# Patient Record
Sex: Female | Born: 1965
Health system: Southern US, Community
[De-identification: ages and names within clinical notes are randomized; demographics above are authoritative.]

## PROBLEM LIST (undated history)

## (undated) DIAGNOSIS — F419 Anxiety disorder, unspecified: Secondary | ICD-10-CM

## (undated) DIAGNOSIS — R011 Cardiac murmur, unspecified: Secondary | ICD-10-CM

## (undated) DIAGNOSIS — Z8719 Personal history of other diseases of the digestive system: Secondary | ICD-10-CM

## (undated) DIAGNOSIS — K219 Gastro-esophageal reflux disease without esophagitis: Secondary | ICD-10-CM

## (undated) DIAGNOSIS — M1990 Unspecified osteoarthritis, unspecified site: Secondary | ICD-10-CM

## (undated) DIAGNOSIS — R112 Nausea with vomiting, unspecified: Secondary | ICD-10-CM

## (undated) DIAGNOSIS — Z9889 Other specified postprocedural states: Secondary | ICD-10-CM

## (undated) DIAGNOSIS — I10 Essential (primary) hypertension: Secondary | ICD-10-CM

## (undated) DIAGNOSIS — D649 Anemia, unspecified: Secondary | ICD-10-CM

## (undated) DIAGNOSIS — I739 Peripheral vascular disease, unspecified: Secondary | ICD-10-CM

## (undated) HISTORY — PX: TUBAL LIGATION: SHX77

## (undated) HISTORY — PX: TONSILLECTOMY: SUR1361

## (undated) HISTORY — PX: BACK SURGERY: SHX140

## (undated) SURGICAL SUPPLY — 56 items
BAND RUBBER #18 3X1/16 STRL (MISCELLANEOUS) ×2
BLADE SURG 11 STRL SS (BLADE)
BUR MATCHSTICK NEURO 3.0 LAGG (BURR) ×1
CONT SPEC 4OZ STRL OR WHT (MISCELLANEOUS) ×1
COVER BACK TABLE 60X90IN (DRAPES) ×1
COVER MAYO STAND STRL (DRAPES) ×1
COVER WAND RF STERILE (DRAPES)
DECANTER SPIKE VIAL GLASS SM (MISCELLANEOUS) ×2
DERMABOND ADVANCED (GAUZE/BANDAGES/DRESSINGS) ×1
DIFFUSER DRILL AIR PNEUMATIC (MISCELLANEOUS) ×1
DRAPE C-ARM 42X72 X-RAY (DRAPES) ×1
DRAPE LAPAROTOMY 100X72X124 (DRAPES) ×1
DRAPE MICROSCOPE LEICA (MISCELLANEOUS) ×1
DRAPE SURG 17X23 STRL (DRAPES)
DRSG OPSITE POSTOP 3X4 (GAUZE/BANDAGES/DRESSINGS) ×1
DURAPREP 26ML APPLICATOR (WOUND CARE) ×1
ELECT BLADE INSULATED 4IN (ELECTROSURGICAL) ×1
ELECT BLADE INSULATED 6.5IN (ELECTROSURGICAL) ×1
ELECT REM PT RETURN 9FT ADLT (ELECTROSURGICAL) ×1
GAUZE 4X4 16PLY RFD (DISPOSABLE)
GAUZE SPONGE 4X4 12PLY STRL (GAUZE/BANDAGES/DRESSINGS)
GLOVE BIO SURGEON STRL SZ 6.5 (GLOVE) ×4
GLOVE BIOGEL PI INDICATOR 6.5 (GLOVE) ×2
GLOVE BIOGEL PI INDICATOR 8 (GLOVE) ×1
GLOVE ECLIPSE 8.0 STRL XLNG CF (GLOVE) ×2
GLOVE SUPERSENSE BIOGEL SZ 6.5 (GLOVE) ×2
GOWN STRL REUS W/TWL 2XL LVL3 (GOWN DISPOSABLE)
GOWN STRL REUS W/TWL LRG LVL3 (GOWN DISPOSABLE) ×4
GOWN STRL REUS W/TWL XL LVL3 (GOWN DISPOSABLE) ×1
HEMOSTAT POWDER KIT SURGIFOAM (HEMOSTASIS) ×1
KIT BASIN OR (CUSTOM PROCEDURE TRAY) ×1
KIT POSITION SURG JACKSON T1 (MISCELLANEOUS) ×1
KIT TURNOVER KIT B (KITS) ×1
MARKER SKIN DUAL TIP RULER LAB (MISCELLANEOUS) ×1
NEEDLE HYPO 18GX1.5 BLUNT FILL (NEEDLE) ×1
NEEDLE HYPO 25X1 1.5 SAFETY (NEEDLE) ×1
NEEDLE SPNL 18GX3.5 QUINCKE PK (NEEDLE)
NEEDLE SPNL 22GX3.5 QUINCKE BK (NEEDLE) ×1
NS IRRIG 1000ML POUR BTL (IV SOLUTION) ×1
OIL CARTRIDGE MAESTRO DRILL (MISCELLANEOUS) ×1
PACK LAMINECTOMY NEURO (CUSTOM PROCEDURE TRAY) ×1
PAD ARMBOARD 7.5X6 YLW CONV (MISCELLANEOUS) ×3
PATTIES SURGICAL .5 X.5 (GAUZE/BANDAGES/DRESSINGS)
PATTIES SURGICAL .5 X1 (DISPOSABLE)
PATTIES SURGICAL 1X1 (DISPOSABLE)
SPONGE LAP 4X18 RFD (DISPOSABLE)
SPONGE SURGIFOAM ABS GEL SZ50 (HEMOSTASIS)
STAPLER VISISTAT 35W (STAPLE)
SUT VIC AB 0 CT1 27 (SUTURE)
SUT VIC AB 2-0 CP2 18 (SUTURE) ×1
SUT VIC AB 3-0 SH 8-18 (SUTURE)
SYR 3ML LL SCALE MARK (SYRINGE) ×1
TOWEL GREEN STERILE (TOWEL DISPOSABLE)
TOWEL GREEN STERILE FF (TOWEL DISPOSABLE)
TRAY FOLEY MTR SLVR 16FR STAT (SET/KITS/TRAYS/PACK)
WATER STERILE IRR 1000ML POUR (IV SOLUTION) ×1

## (undated) SURGICAL SUPPLY — 61 items
BAG COUNTER SPONGE SURGICOUNT (BAG) ×1
BAND RUBBER #18 3X1/16 STRL (MISCELLANEOUS) ×2
BUR 2.5 MTCH HD 16 (BUR) ×1
BUR MATCHSTICK NEURO 3.0 LAGG (BURR) ×1
CASCADIA AN CONVEX 8.5X28X11 (Cage) ×1 IMPLANT
CONT SPEC 4OZ STRL OR WHT (MISCELLANEOUS) ×1
COVER BACK TABLE 60X90IN (DRAPES) ×1
COVER MAYO STAND STRL (DRAPES) ×2
DERMABOND ADVANCED (GAUZE/BANDAGES/DRESSINGS) ×1
DRAIN JACKSON RD 7FR 3/32 (WOUND CARE)
DRAPE C-ARM 42X72 X-RAY (DRAPES) ×2
DRAPE LAPAROTOMY 100X72X124 (DRAPES) ×1
DRAPE MICROSCOPE LEICA (MISCELLANEOUS) ×1
DRAPE STERI IOBAN 125X83 (DRAPES)
DRAPE SURG 17X23 STRL (DRAPES) ×1
DRSG OPSITE POSTOP 3X4 (GAUZE/BANDAGES/DRESSINGS) ×2
DRSG OPSITE POSTOP 4X6 (GAUZE/BANDAGES/DRESSINGS) ×1
DURAPREP 26ML APPLICATOR (WOUND CARE) ×1
ELECT BLADE INSULATED 6.5IN (ELECTROSURGICAL) ×1
ELECT REM PT RETURN 9FT ADLT (ELECTROSURGICAL) ×1
GAUZE 4X4 16PLY ~~LOC~~+RFID DBL (SPONGE)
GAUZE SPONGE 4X4 12PLY STRL (GAUZE/BANDAGES/DRESSINGS) ×1
GLOVE BIOGEL PI INDICATOR 8 (GLOVE) ×2
GLOVE ECLIPSE 8.0 STRL XLNG CF (GLOVE) ×2
GLOVE SURG ENC MOIS LTX SZ8 (GLOVE) ×2
GLOVE SURG UNDER POLY LF SZ8.5 (GLOVE) ×2
GOWN STRL REUS W/TWL 2XL LVL3 (GOWN DISPOSABLE)
GOWN STRL REUS W/TWL LRG LVL3 (GOWN DISPOSABLE)
GOWN STRL REUS W/TWL XL LVL3 (GOWN DISPOSABLE) ×2
GUIDEWIRE EVEREST 1.4X620 2-PK (WIRE) ×4
HEMOSTAT POWDER KIT SURGIFOAM (HEMOSTASIS) ×1
KIT BASIN OR (CUSTOM PROCEDURE TRAY) ×1
KIT INFUSE XX SMALL 0.7CC (Orthopedic Implant) ×1 IMPLANT
KIT POSITION SURG JACKSON T1 (MISCELLANEOUS) ×1
KIT TURNOVER KIT B (KITS) ×1
MARKER SKIN DUAL TIP RULER LAB (MISCELLANEOUS) ×1
NEEDLE BIOPSY DD SERENGETI 8G (NEEDLE) ×1
NEEDLE HYPO 25X1 1.5 SAFETY (NEEDLE) ×1
NEEDLE SPNL 22GX3.5 QUINCKE BK (NEEDLE) ×1
NS IRRIG 1000ML POUR BTL (IV SOLUTION) ×1
PACK LAMINECTOMY NEURO (CUSTOM PROCEDURE TRAY) ×1
PAD ARMBOARD 7.5X6 YLW CONV (MISCELLANEOUS) ×3
PATTIES SURGICAL .5 X.5 (GAUZE/BANDAGES/DRESSINGS)
PATTIES SURGICAL .5 X1 (DISPOSABLE)
PATTIES SURGICAL 1X1 (DISPOSABLE)
PUTTY BONE 100 VESUVIUS 2.5CC (Putty) ×1 IMPLANT
ROD CONT BN EVEREST 5.5X65 (Rod) ×2 IMPLANT
SCREW PA FENS EVEREST 6.5X35 (Screw) ×2 IMPLANT
SCREW PA FENS EVEREST 6.5X40 (Screw) ×2 IMPLANT
SCREW PA FENS EVEREST 6.5X50 (Screw) ×2 IMPLANT
SET SCREW (Screw) ×6 IMPLANT
SPONGE SURGIFOAM ABS GEL SZ50 (HEMOSTASIS) ×1
SPONGE T-LAP 4X18 ~~LOC~~+RFID (SPONGE)
STAPLER VISISTAT 35W (STAPLE)
SUT VIC AB 0 CT1 8-18 (SUTURE) ×2
SUT VIC AB 2-0 CP2 18 (SUTURE) ×3
SUT VIC AB 3-0 SH 8-18 (SUTURE) ×2
TOWEL GREEN STERILE (TOWEL DISPOSABLE)
TOWEL GREEN STERILE FF (TOWEL DISPOSABLE)
TRAY FOLEY MTR SLVR 16FR STAT (SET/KITS/TRAYS/PACK) ×1
WATER STERILE IRR 1000ML POUR (IV SOLUTION) ×1

---

## 2012-06-08 DIAGNOSIS — I82409 Acute embolism and thrombosis of unspecified deep veins of unspecified lower extremity: Secondary | ICD-10-CM

## 2012-06-08 HISTORY — DX: Acute embolism and thrombosis of unspecified deep veins of unspecified lower extremity: I82.409

## 2017-06-18 DIAGNOSIS — J329 Chronic sinusitis, unspecified: Secondary | ICD-10-CM | POA: Diagnosis not present

## 2017-06-18 DIAGNOSIS — R05 Cough: Secondary | ICD-10-CM | POA: Diagnosis not present

## 2017-06-18 DIAGNOSIS — R009 Unspecified abnormalities of heart beat: Secondary | ICD-10-CM | POA: Diagnosis not present

## 2017-06-23 DIAGNOSIS — R05 Cough: Secondary | ICD-10-CM | POA: Diagnosis not present

## 2017-06-23 DIAGNOSIS — J329 Chronic sinusitis, unspecified: Secondary | ICD-10-CM | POA: Diagnosis not present

## 2017-06-23 DIAGNOSIS — R062 Wheezing: Secondary | ICD-10-CM | POA: Diagnosis not present

## 2018-03-18 DIAGNOSIS — Z01419 Encounter for gynecological examination (general) (routine) without abnormal findings: Secondary | ICD-10-CM | POA: Diagnosis not present

## 2018-03-18 DIAGNOSIS — Z124 Encounter for screening for malignant neoplasm of cervix: Secondary | ICD-10-CM | POA: Diagnosis not present

## 2018-03-18 DIAGNOSIS — Z1231 Encounter for screening mammogram for malignant neoplasm of breast: Secondary | ICD-10-CM | POA: Diagnosis not present

## 2018-05-02 DIAGNOSIS — Z0000 Encounter for general adult medical examination without abnormal findings: Secondary | ICD-10-CM | POA: Diagnosis not present

## 2018-05-02 DIAGNOSIS — Z1231 Encounter for screening mammogram for malignant neoplasm of breast: Secondary | ICD-10-CM | POA: Diagnosis not present

## 2018-05-02 DIAGNOSIS — Z6841 Body Mass Index (BMI) 40.0 and over, adult: Secondary | ICD-10-CM | POA: Diagnosis not present

## 2018-07-09 DIAGNOSIS — J111 Influenza due to unidentified influenza virus with other respiratory manifestations: Secondary | ICD-10-CM | POA: Diagnosis not present

## 2018-07-10 DIAGNOSIS — Z792 Long term (current) use of antibiotics: Secondary | ICD-10-CM | POA: Diagnosis not present

## 2018-07-10 DIAGNOSIS — E860 Dehydration: Secondary | ICD-10-CM | POA: Diagnosis not present

## 2018-07-10 DIAGNOSIS — J111 Influenza due to unidentified influenza virus with other respiratory manifestations: Secondary | ICD-10-CM | POA: Diagnosis not present

## 2018-08-29 DIAGNOSIS — R05 Cough: Secondary | ICD-10-CM | POA: Diagnosis not present

## 2018-08-29 DIAGNOSIS — Z7689 Persons encountering health services in other specified circumstances: Secondary | ICD-10-CM | POA: Diagnosis not present

## 2018-09-09 DIAGNOSIS — Z6841 Body Mass Index (BMI) 40.0 and over, adult: Secondary | ICD-10-CM | POA: Diagnosis not present

## 2018-09-09 DIAGNOSIS — J40 Bronchitis, not specified as acute or chronic: Secondary | ICD-10-CM | POA: Diagnosis not present

## 2019-04-28 DIAGNOSIS — Z20822 Contact with and (suspected) exposure to covid-19: Secondary | ICD-10-CM

## 2020-05-07 NOTE — Progress Notes (Signed)
Your procedure is scheduled on Friday, December 3rd.  Report to [facility] Main Entrance "A" at 5:30 A.M., and check in at the Admitting office.  Call this number if you have problems the morning of surgery:  [phone]  Call [phone] if you have any questions prior to your surgery date Monday-Friday 8am-4pm   Remember:  Do not eat or drink after midnight the night before your surgery    Take these medicines the morning of surgery with A SIP OF WATER  cyclobenzaprine (FLEXERIL) docusate sodium (COLACE) esomeprazole (NEXIUM) gabapentin (NEURONTIN) sertraline (ZOLOFT)   If needed: acetaminophen (TYLENOL)   As of today, STOP taking any Aspirin (unless otherwise instructed by your surgeon) Aleve, Naproxen, Ibuprofen, Motrin, Advil, Goody's, BC's, all herbal medications, fish oil, and all vitamins.                     Do not wear jewelry, make up, or nail polish            Do not wear lotions, powders, perfumes or deodorant.            Do not shave 48 hours prior to surgery.              Do not bring valuables to the hospital.            [facility] is not responsible for any belongings or valuables.  Do NOT Smoke (Tobacco/Vaping) or drink Alcohol 24 hours prior to your procedure If you use a CPAP at night, you may bring all equipment for your overnight stay.   Contacts, glasses, dentures or bridgework may not be worn into surgery.      For patients admitted to the hospital, discharge time will be determined by your treatment team.   Patients discharged the day of surgery will not be allowed to drive home, and someone needs to stay with them for 24 hours.  Special instructions:   [place]- Preparing For Surgery  Before surgery, you can play an important role. Because skin is not sterile, your skin needs to be as free of germs as possible. You can reduce the number of germs on your skin by washing with CHG (chlorahexidine gluconate) Soap before surgery.  CHG is an antiseptic  cleaner which kills germs and bonds with the skin to continue killing germs even after washing.    Oral Hygiene is also important to reduce your risk of infection.  Remember - BRUSH YOUR TEETH THE MORNING OF SURGERY WITH YOUR REGULAR TOOTHPASTE  Please do not use if you have an allergy to CHG or antibacterial soaps. If your skin becomes reddened/irritated stop using the CHG.  Do not shave (including legs and underarms) for at least 48 hours prior to first CHG shower. It is OK to shave your face.  Please follow these instructions carefully.   1. Shower the NIGHT BEFORE SURGERY and the MORNING OF SURGERY with CHG Soap.   2. If you chose to wash your hair, wash your hair first as usual with your normal shampoo.  3. After you shampoo, rinse your hair and body thoroughly to remove the shampoo.  4. Use CHG as you would any other liquid soap. You can apply CHG directly to the skin and wash gently with a scrungie or a clean washcloth.   5. Apply the CHG Soap to your body ONLY FROM THE NECK DOWN.  Do not use on open wounds or open sores. Avoid contact with your eyes, ears,  mouth and genitals (private parts). Wash Face and genitals (private parts)  with your normal soap.   6. Wash thoroughly, paying special attention to the area where your surgery will be performed.  7. Thoroughly rinse your body with warm water from the neck down.  8. DO NOT shower/wash with your normal soap after using and rinsing off the CHG Soap.  9. Pat yourself dry with a CLEAN TOWEL.  10. Wear CLEAN PAJAMAS to bed the night before surgery  11. Place CLEAN SHEETS on your bed the night of your first shower and DO NOT SLEEP WITH PETS.  Day of Surgery: Wear Clean/Comfortable clothing the morning of surgery Do not apply any deodorants/lotions.   Remember to brush your teeth WITH YOUR REGULAR TOOTHPASTE.   Please read over the following fact sheets that you were given.

## 2020-05-08 DIAGNOSIS — Z01812 Encounter for preprocedural laboratory examination: Secondary | ICD-10-CM | POA: Diagnosis not present

## 2020-05-08 DIAGNOSIS — Z20822 Contact with and (suspected) exposure to covid-19: Secondary | ICD-10-CM | POA: Diagnosis not present

## 2020-05-08 HISTORY — DX: Unspecified osteoarthritis, unspecified site: M19.90

## 2020-05-08 HISTORY — DX: Peripheral vascular disease, unspecified: I73.9

## 2020-05-08 HISTORY — DX: Anemia, unspecified: D64.9

## 2020-05-08 HISTORY — DX: Gastro-esophageal reflux disease without esophagitis: K21.9

## 2020-05-08 HISTORY — DX: Nausea with vomiting, unspecified: Z98.890

## 2020-05-08 HISTORY — DX: Essential (primary) hypertension: I10

## 2020-05-08 HISTORY — DX: Anxiety disorder, unspecified: F41.9

## 2020-05-08 HISTORY — DX: Other specified postprocedural states: R11.2

## 2020-05-08 HISTORY — DX: Personal history of other diseases of the digestive system: Z87.19

## 2020-05-08 HISTORY — DX: Cardiac murmur, unspecified: R01.1

## 2020-05-08 NOTE — Progress Notes (Signed)
PCP - Cristal [address] (white [facility]) Cardiologist - denies  PPM/ICD - denies   Chest x-ray - n/a EKG - 05/08/2020 Stress Test - denies ECHO - denies Cardiac Cath - denies  Sleep Study - denies  Patient instructed to hold all Aspirin, NSAID's, herbal medications, fish oil and vitamins 7 days prior to surgery. Last dose of aspirin 81mg  was 05/01/2020.   ERAS Protcol -no   COVID TEST- 05/08/2020   Anesthesia review: no  Patient denies shortness of breath, fever, cough and chest pain at PAT appointment   All instructions explained to the patient, with a verbal understanding of the material. Patient agrees to go over the instructions while at home for a better understanding. Patient also instructed to self quarantine after being tested for COVID-19. The opportunity to ask questions was provided.

## 2020-05-08 NOTE — Progress Notes (Signed)
Your procedure is scheduled on Friday, December 3rd.  Report to [facility] Main Entrance "A" at 5:30 A.M., and check in at the Admitting office.  Call this number if you have problems the morning of surgery:  [phone]  Call [phone] if you have any questions prior to your surgery date Monday-Friday 8am-4pm   Remember:  Do not eat or drink after midnight the night before your surgery    Take these medicines the morning of surgery with A SIP OF WATER  cyclobenzaprine (FLEXERIL) esomeprazole (NEXIUM) gabapentin (NEURONTIN) sertraline (ZOLOFT)   If needed: acetaminophen (TYLENOL)   As of today, STOP taking any Aspirin (unless otherwise instructed by your surgeon) Aleve, Naproxen, Ibuprofen, Motrin, Advil, Goody's, BC's, all herbal medications, fish oil, and all vitamins.                     Do not wear jewelry, make up, or nail polish            Do not wear lotions, powders, perfumes or deodorant.            Do not shave 48 hours prior to surgery.              Do not bring valuables to the hospital.            [facility] is not responsible for any belongings or valuables.  Do NOT Smoke (Tobacco/Vaping) or drink Alcohol 24 hours prior to your procedure If you use a CPAP at night, you may bring all equipment for your overnight stay.   Contacts, glasses, dentures or bridgework may not be worn into surgery.      For patients admitted to the hospital, discharge time will be determined by your treatment team.   Patients discharged the day of surgery will not be allowed to drive home, and someone needs to stay with them for 24 hours.  Special instructions:   [place]- Preparing For Surgery  Before surgery, you can play an important role. Because skin is not sterile, your skin needs to be as free of germs as possible. You can reduce the number of germs on your skin by washing with CHG (chlorahexidine gluconate) Soap before surgery.  CHG is an antiseptic cleaner which kills germs  and bonds with the skin to continue killing germs even after washing.    Oral Hygiene is also important to reduce your risk of infection.  Remember - BRUSH YOUR TEETH THE MORNING OF SURGERY WITH YOUR REGULAR TOOTHPASTE  Please do not use if you have an allergy to CHG or antibacterial soaps. If your skin becomes reddened/irritated stop using the CHG.  Do not shave (including legs and underarms) for at least 48 hours prior to first CHG shower. It is OK to shave your face.  Please follow these instructions carefully.   1. Shower the NIGHT BEFORE SURGERY and the MORNING OF SURGERY with CHG Soap.   2. If you chose to wash your hair, wash your hair first as usual with your normal shampoo.  3. After you shampoo, rinse your hair and body thoroughly to remove the shampoo.  4. Use CHG as you would any other liquid soap. You can apply CHG directly to the skin and wash gently with a scrungie or a clean washcloth.   5. Apply the CHG Soap to your body ONLY FROM THE NECK DOWN.  Do not use on open wounds or open sores. Avoid contact with your eyes, ears, mouth and genitals (  private parts). Wash Face and genitals (private parts)  with your normal soap.   6. Wash thoroughly, paying special attention to the area where your surgery will be performed.  7. Thoroughly rinse your body with warm water from the neck down.  8. DO NOT shower/wash with your normal soap after using and rinsing off the CHG Soap.  9. Pat yourself dry with a CLEAN TOWEL.  10. Wear CLEAN PAJAMAS to bed the night before surgery  11. Place CLEAN SHEETS on your bed the night of your first shower and DO NOT SLEEP WITH PETS.  Day of Surgery: Wear Clean/Comfortable clothing the morning of surgery Do not apply any deodorants/lotions.   Remember to brush your teeth WITH YOUR REGULAR TOOTHPASTE.   Please read over the following fact sheets that you were given.

## 2020-05-09 NOTE — Anesthesia Preprocedure Evaluation (Addendum)
Anesthesia Evaluation  Patient identified by MRN, date of birth, ID band Patient awake    Reviewed: Allergy & Precautions, NPO status , Patient's Chart, lab work & pertinent test results  History of Anesthesia Complications (+) PONVNegative for: history of anesthetic complications  Airway Mallampati: III  TM Distance: >3 FB Neck ROM: Full    Dental  (+) Teeth Intact   Pulmonary neg pulmonary ROS,    Pulmonary exam normal        Cardiovascular hypertension, Pt. on medications + Peripheral Vascular Disease and + DVT  Normal cardiovascular exam     Neuro/Psych Anxiety negative neurological ROS     GI/Hepatic Neg liver ROS, hiatal hernia, GERD  ,  Endo/Other  Morbid obesity  Renal/GU negative Renal ROS  negative genitourinary   Musculoskeletal  (+) Arthritis ,   Abdominal   Peds  Hematology negative hematology ROS (+)   Anesthesia Other Findings   Reproductive/Obstetrics                            Anesthesia Physical Anesthesia Plan  ASA: III  Anesthesia Plan: General   Post-op Pain Management:    Induction: Intravenous  PONV Risk Score and Plan: 4 or greater and Ondansetron, Dexamethasone, Treatment may vary due to age or medical condition and Midazolam  Airway Management Planned: Oral ETT  Additional Equipment: None  Intra-op Plan:   Post-operative Plan: Extubation in OR  Informed Consent: I have reviewed the patients History and Physical, chart, labs and discussed the procedure including the risks, benefits and alternatives for the proposed anesthesia with the patient or authorized representative who has indicated his/her understanding and acceptance.     Dental advisory given  Plan Discussed with:   Anesthesia Plan Comments:        Anesthesia Quick Evaluation

## 2020-05-10 DIAGNOSIS — Z6841 Body Mass Index (BMI) 40.0 and over, adult: Secondary | ICD-10-CM | POA: Diagnosis not present

## 2020-05-10 DIAGNOSIS — Z79899 Other long term (current) drug therapy: Secondary | ICD-10-CM | POA: Insufficient documentation

## 2020-05-10 DIAGNOSIS — I739 Peripheral vascular disease, unspecified: Secondary | ICD-10-CM | POA: Insufficient documentation

## 2020-05-10 DIAGNOSIS — Z86718 Personal history of other venous thrombosis and embolism: Secondary | ICD-10-CM | POA: Diagnosis not present

## 2020-05-10 DIAGNOSIS — M5116 Intervertebral disc disorders with radiculopathy, lumbar region: Secondary | ICD-10-CM | POA: Insufficient documentation

## 2020-05-10 DIAGNOSIS — Z419 Encounter for procedure for purposes other than remedying health state, unspecified: Secondary | ICD-10-CM

## 2020-05-10 DIAGNOSIS — M1990 Unspecified osteoarthritis, unspecified site: Secondary | ICD-10-CM | POA: Diagnosis not present

## 2020-05-10 DIAGNOSIS — Z7982 Long term (current) use of aspirin: Secondary | ICD-10-CM | POA: Diagnosis not present

## 2020-05-10 DIAGNOSIS — I10 Essential (primary) hypertension: Secondary | ICD-10-CM | POA: Diagnosis not present

## 2020-05-10 DIAGNOSIS — Z791 Long term (current) use of non-steroidal anti-inflammatories (NSAID): Secondary | ICD-10-CM | POA: Insufficient documentation

## 2020-05-10 DIAGNOSIS — K219 Gastro-esophageal reflux disease without esophagitis: Secondary | ICD-10-CM | POA: Diagnosis not present

## 2020-05-10 HISTORY — PX: LUMBAR LAMINECTOMY/ DECOMPRESSION WITH MET-RX: SHX5959

## 2020-05-10 MED ORDER — ASPIRIN EC 81 MG PO TBEC
81.0000 mg | DELAYED_RELEASE_TABLET | Freq: Every day | ORAL | 11 refills | Status: DC
Start: 2020-05-17 — End: 2021-09-19

## 2020-05-10 MED FILL — METHOCARBAMOL 500 MG TABS: 500 | 20 days supply | Qty: 60 | Fill #0

## 2020-05-10 MED FILL — HYDROCODON-APAP 5-325: 5-325 | 7 days supply | Qty: 30 | Fill #0

## 2020-05-10 NOTE — Transfer of Care (Signed)
Immediate Anesthesia Transfer of Care Note  Patient: Patty Wong  Procedure(s) Performed: MICRODISCECTOMY LEFT LUMBAR THREE- LUMBAR FOUR (N/A Spine Lumbar)  Patient Location: PACU  Anesthesia Type:General  Level of Consciousness: awake, patient cooperative and responds to stimulation  Airway & Oxygen Therapy: Patient Spontanous Breathing and Patient connected to face mask oxygen  Post-op Assessment: Report given to RN, Post -op Vital signs reviewed and stable and Patient moving all extremities X 4  Post vital signs: Reviewed and stable  Last Vitals:  Vitals Value Taken Time  BP 119/89 05/10/20 0943  Temp 36.4 C 05/10/20 0940  Pulse 92 05/10/20 0944  Resp 28 05/10/20 0944  SpO2 100 % 05/10/20 0944  Vitals shown include unvalidated device data.  Last Pain:  Vitals:   05/10/20 0604  TempSrc: Oral  PainSc:       Patients Stated Pain Goal: 2 (05/10/20 0558)  Complications: No complications documented.

## 2020-05-10 NOTE — Anesthesia Postprocedure Evaluation (Signed)
Anesthesia Post Note  Patient: Patty Wong  Procedure(s) Performed: MICRODISCECTOMY LEFT LUMBAR THREE- LUMBAR FOUR (N/A Spine Lumbar)     Patient location during evaluation: PACU Anesthesia Type: General Level of consciousness: awake and alert Pain management: pain level controlled Vital Signs Assessment: post-procedure vital signs reviewed and stable Respiratory status: spontaneous breathing, nonlabored ventilation and respiratory function stable Cardiovascular status: blood pressure returned to baseline and stable Postop Assessment: no apparent nausea or vomiting Anesthetic complications: no   No complications documented.  Last Vitals:  Vitals:   05/10/20 1015 05/10/20 1030  BP: 117/79 122/81  Pulse: 91 90  Resp: 11 15  Temp:    SpO2: 100% 100%    Last Pain:  Vitals:   05/10/20 1045  TempSrc:   PainSc: 0-No pain                 Lucretia Kern

## 2020-05-10 NOTE — Progress Notes (Signed)
   Providing Compassionate, Quality Care - Together  NEUROSURGERY PROGRESS NOTE   S: Patient seen and examined in PACU  O: EXAM:  BP 122/81   Pulse 90   Temp 97.6 F (36.4 C)   Resp 15   Ht 5\' 5"  (1.651 m)   Wt 117.8 kg   SpO2 100%   BMI 43.20 kg/m   Awake, alert, oriented  Speech fluent, appropriate  CNs grossly intact  5/5 BUE/BLE  Incision clean dry intact, dressing clean dry intact Sensory intact light touch  ASSESSMENT:  54 y.o. female with  1.  L4 radiculopathy from L3-4 inferiorly migrated herniated nucleus pulposus, left  PLAN: -DC home -Pain controlled, preop pain improved -Prescription given -Has follow-up appointment -Light activity and wound care discussed with patient significant other     Thank you for allowing me to participate in this patient's care.  Please do not hesitate to call with questions or concerns.   56, DO Neurosurgeon [facility] Cell: [phone]

## 2020-05-10 NOTE — Anesthesia Procedure Notes (Signed)
Procedure Name: Intubation Date/Time: 05/10/2020 7:42 AM Performed by: Verdie Drown, CRNA Pre-anesthesia Checklist: Patient identified, Emergency Drugs available, Suction available and Patient being monitored Patient Re-evaluated:Patient Re-evaluated prior to induction Oxygen Delivery Method: Circle System Utilized Preoxygenation: Pre-oxygenation with 100% oxygen Induction Type: IV induction Ventilation: Mask ventilation without difficulty Laryngoscope Size: Mac and 3 Grade View: Grade II Tube type: Oral Tube size: 7.5 mm Number of attempts: 1 Airway Equipment and Method: Stylet and Oral airway Placement Confirmation: ETT inserted through vocal cords under direct vision,  positive ETCO2 and breath sounds checked- equal and bilateral Secured at: 22 cm Tube secured with: Tape Dental Injury: Teeth and Oropharynx as per pre-operative assessment

## 2020-05-10 NOTE — Op Note (Signed)
Date of service: 05/10/2020  PREOP DIAGNOSIS: Lumbar disc herniation, L3-4, left with left L4 radiculopathy  POSTOP DIAGNOSIS: Same  PROCEDURE: 1.  L3-4 laminotomy, medial facetectomy and microdiscectomy for decompression of left L4 nerve root 2. Use of operating microscope 3. Use of intraoperative fluoroscopy  SURGEON: Dr. Kendell Bane C. Kiara Mcdowell, DO  ASSISTANT: None  ANESTHESIA: General Endotracheal  EBL: 10 cc  SPECIMENS: None  DRAINS: None  COMPLICATIONS: None  CONDITION: Hemodynamically stable  HISTORY: Patty Wong is a 54 y.o. female presented with left lower extremity L4 radiculopathy and weakness and on imaging was found to have a left L3-4 herniated nucleus pulposus with inferior migration from the L3-4 disc to the left L4 pedicle.  She tried conservative measures including medication and light activity in which her pain progressed and she failed conservative therapies. She continued to have buckling episodes of her LLE while walking and severe pain, numbness and tingling. We discussed surgical intervention and she agreed to proceed.  Risks and benefits were discussed and agreed upon.  PROCEDURE IN DETAIL: After informed consent was obtained and witnessed, the patient was brought to the operating room. After induction of general anesthesia, the patient was positioned on the operative table in the prone position with all pressure points meticulously padded. The skin of the low back was then prepped and draped in the usual sterile fashion.  Under fluoroscopy, the L3-4 level was identified and marked out on the skin, and after timeout was conducted, the skin was infiltrated with local anesthetic. Skin incision was then made sharply and Bovie electrocautery was used to dissect the subcutaneous tissue until the lumbodorsal fascia was identified. The fascia was then incised using Bovie electrocautery.  Using the Metrix dilators, the left L3- lamina-facet junction was docked and a 8 mm x 20  mm tapered tube was placed.  Lateral fluoroscopy confirmed appropriate placement.  The microscope was sterilely draped and brought into the field.  Using Bovie electrocautery, the left L3 lamina and medial facet were exposed of soft tissue.  Using a high-speed drill, laminotomy was completed with a partial medial facetectomy. The ligamentum flavum was then identified and removed and the lateral edge of the thecal sac was identified. This was then traced down to identify the traversing L4 nerve root. Dissection was then carried out superior and lateral to the nerve root to identify the disc herniation. The posterior annulus was then coagulated with bipolar forceps and incised and using a combination of dissectors, curettes, and rongeurs, the herniated disc fragment was removed.  This fragment was noted again to be inferiorly migrated below the disc space along the left L4 pedicle.  Multiple small fragments were removed. The decompression of the nerve root was confirmed using a micro Murphy ball probe, as it was traced in the lateral recess and along the L4 pedicle.  Hemostasis was then secured using a combination of morcellized Gelfoam and thrombin and bipolar electrocautery. The nerve root was then covered with a long-acting steroid solution (Depo-Medrol, 40 mg).  The Metrix tube was removed and hemostasis was achieved with bipolar cautery in the soft tissues.  The wound was closed in layers with 2-0 Vicryl sutures. The skin was closed using standard skin glue.  Sterile dressing was applied.  At the end of the case all sponge, needle, and instrument counts were correct. The patient was transferred to the stretcher, extubated and taken to the postanesthesia care unit in stable hemodynamic condition.

## 2020-05-10 NOTE — H&P (Signed)
Providing Compassionate, Quality Care - Together  NEUROSURGERY HISTORY & PHYSICAL   Patty Wong is an 54 y.o. female.   Chief Complaint: Left lower extremity radiculopathy HPI: This is a pleasant 54 year old female complains of left lower extremity radiculopathy.  She has numbness, tingling and burning in her left anterior lateral thigh radiating to her shin.  She has episodes where her leg would give out and had difficulty walking upstairs.  We tried conservative measurements which she failed and her pain continued to progress.  She elected to undergo surgery and presents today for an L3-4 microdiscectomy.  She denies any new symptoms at this point in time.  Past Medical History:  Diagnosis Date  . Anemia    while pregnant  . Anxiety   . Arthritis    back  . DVT (deep venous thrombosis) (HCC) 2014   calf- unknown which leg  . GERD (gastroesophageal reflux disease)   . Heart murmur   . History of hiatal hernia   . Hypertension   . Peripheral vascular disease (HCC)   . PONV (postoperative nausea and vomiting)     Past Surgical History:  Procedure Laterality Date  . BACK SURGERY     2007, 1994  . CESAREAN SECTION     1989, 1999  . TONSILLECTOMY     removed as a child    History reviewed. No pertinent family history. Social History:  reports that she has never smoked. She has never used smokeless tobacco. She reports previous alcohol use. She reports previous drug use.  Allergies: No Known Allergies  Medications Prior to Admission  Medication Sig Dispense Refill  . acetaminophen (TYLENOL) 500 MG tablet Take 500 mg by mouth every 4 (four) hours as needed for mild pain or moderate pain.    . calcium elemental as carbonate (TUMS ULTRA 1000) 400 MG chewable tablet Chew 1,000 mg by mouth daily as needed for heartburn.    . cyclobenzaprine (FLEXERIL) 10 MG tablet Take 10 mg by mouth every 8 (eight) hours.    . docusate sodium (COLACE) 100 MG capsule Take 200 mg by mouth  daily.    . enalapril-hydrochlorothiazide (VASERETIC) 10-25 MG tablet Take 1 tablet by mouth daily.    Marland Kitchen esomeprazole (NEXIUM) 20 MG capsule Take 20 mg by mouth daily at 12 noon.    . gabapentin (NEURONTIN) 300 MG capsule Take 300 mg by mouth every 8 (eight) hours.    Marland Kitchen ibuprofen (ADVIL) 200 MG tablet Take 800 mg by mouth every 6 (six) hours as needed for mild pain or moderate pain.    . Multiple Vitamins-Minerals (HAIR SKIN AND NAILS FORMULA PO) Take 1 tablet by mouth daily as needed.    . Multiple Vitamins-Minerals (MULTIVITAMIN WITH MINERALS) tablet Take 1 tablet by mouth daily. Woman 50+    . naproxen sodium (ALEVE) 220 MG tablet Take 440 mg by mouth daily as needed (Pain). Alternate with IB    . polycarbophil (FIBERCON) 625 MG tablet Take 625 mg by mouth daily.    . sertraline (ZOLOFT) 50 MG tablet Take 50 mg by mouth daily.    Marland Kitchen aspirin EC 81 MG tablet Take 81 mg by mouth daily. Swallow whole.  Last dose 05/01/2020      Results for orders placed or performed during the hospital encounter of 05/10/20 (from the past 48 hour(s))  Pregnancy, urine POC     Status: None   Collection Time: 05/10/20  6:26 AM  Result Value Ref Range  Preg Test, Ur NEGATIVE NEGATIVE    Comment:        THE SENSITIVITY OF THIS METHODOLOGY IS >24 mIU/mL    No results found.  ROS 14 pt performed and all pos/neg listed above Blood pressure 126/88, pulse 77, temperature 97.8 F (36.6 C), temperature source Oral, resp. rate 17, height 5\' 5"  (1.651 m), weight 117.8 kg, SpO2 100 %. Physical Exam  AOx3 PERRL CN 2-12 intact BUE 5/5 LLE 4/5, + SLR RLE 5/5 SILT except L4 on Left  Assessment/Plan 54 yo F with:  1. L4 radiculopathy from L3-4 HNP on the left   -OR today for microdisc -all risks and benefits discussed and agreed upon   Thank you for allowing me to participate in this patient's care.  Please do not hesitate to call with questions or concerns.   56, DO Neurosurgeon [facility]  [facility] Cell: [phone]

## 2020-05-10 NOTE — Discharge Instructions (Signed)
OK TO SHOWER IN 3 DAYS NO TAKING A BATH UNTIL SEEN IN THE OFFICE OK FOR ASPIRIN RESUMPTION IN 7 DAYS TAKE DRESSING OFF IN 3 DAYS, NO NEED TO KEEP DRESSING ON BEYOND THAT

## 2020-06-11 DIAGNOSIS — M6281 Muscle weakness (generalized): Secondary | ICD-10-CM | POA: Diagnosis present

## 2020-06-11 DIAGNOSIS — Z9889 Other specified postprocedural states: Secondary | ICD-10-CM

## 2020-06-11 DIAGNOSIS — M5442 Lumbago with sciatica, left side: Secondary | ICD-10-CM | POA: Insufficient documentation

## 2020-06-11 DIAGNOSIS — G8929 Other chronic pain: Secondary | ICD-10-CM

## 2020-06-11 DIAGNOSIS — M2560 Stiffness of unspecified joint, not elsewhere classified: Secondary | ICD-10-CM

## 2020-06-13 DIAGNOSIS — Z9889 Other specified postprocedural states: Secondary | ICD-10-CM

## 2020-06-13 DIAGNOSIS — M2560 Stiffness of unspecified joint, not elsewhere classified: Secondary | ICD-10-CM

## 2020-06-13 DIAGNOSIS — M6281 Muscle weakness (generalized): Secondary | ICD-10-CM

## 2020-06-13 DIAGNOSIS — G8929 Other chronic pain: Secondary | ICD-10-CM

## 2020-06-13 NOTE — Therapy (Signed)
[facility] [address] [place], [place], [address] Phone: [phone]   Fax:  [phone]  Physical Therapy Treatment  Patient Details  Name: Patty Wong MRN: [id number] Date of Birth: Sep 03, 1965 Referring Provider (PT): Elwin Sleight, DO   Encounter Date: 06/13/2020    PT End of Session - 06/13/20 0819    Visit Number 2    Number of Visits 9    Date for PT Re-Evaluation 07/09/20    Authorization - Visit Number 2    Authorization - Number of Visits 10    PT Start Time 0813 to 0905   Activity Tolerance Patient tolerated treatment well    Behavior During Therapy [facility] for tasks assessed/performed           Past Medical History:  Diagnosis Date  . Anemia    while pregnant  . Anxiety   . Arthritis    back  . DVT (deep venous thrombosis) ([place]) 2014   calf- unknown which leg  . GERD (gastroesophageal reflux disease)   . Heart murmur   . History of hiatal hernia   . Hypertension   . Peripheral vascular disease ([place])   . PONV (postoperative nausea and vomiting)     Past Surgical History:  Procedure Laterality Date  . BACK SURGERY     2007, 1994  . [place]  . LUMBAR LAMINECTOMY/ DECOMPRESSION WITH MET-RX N/A 05/10/2020   Procedure: MICRODISCECTOMY LEFT LUMBAR THREE- LUMBAR FOUR;  Surgeon: Karsten Ro, DO;  Location: [place];  Service: Neurosurgery;  Laterality: N/A;  posterior  . TONSILLECTOMY     removed as a child    There were no vitals filed for this visit.   Subjective Assessment - 06/13/20 0817    Subjective Pt. reports no low back pain prior to tx. session.  Pt. reports good compliance with core ex. but difficulty maintaining TrA contraction with bicycle kicks.    Pertinent History 2007: 2nd surgery (hardward/ screws in place).  Pt. is a Audiological scientist at [employer].    Limitations Lifting;Standing;Walking;House hold activities    Patient Stated Goals Decrease low back/ L LE  symptoms and return to work without limitations.    Currently in Pain? No/denies              There.ex.:  Nustep L4 10 min. B UE/LE (cuing for TrA muscle activation).    Supine TrA ex. (reviewed HEP)- added GTB hip abduction/ marching.    Manual tx.:  Supine LE stretches (generalized) with focus on hamstring/ piriformis/ trunk rotn./ sciatic nerve glides.    Seated STM to low back (minimal tenderness)- L>R     Pt. is moving around PT clinic with normalized gait pattern and good posture. Pt. progressing well with core stability program in supine position. Good hamstring/ hip flexibility bilaterally. Minimal tenderness in low back (L>R) with STM. Pt. will continue with current HEP.      Patient will benefit from skilled therapeutic intervention in order to improve the following deficits and impairments:  Decreased activity tolerance,Decreased endurance,Decreased range of motion,Decreased strength,Hypomobility,Improper body mechanics,Pain,Obesity,Postural dysfunction,Impaired flexibility,Decreased mobility  Visit Diagnosis: Status post lumbar microdiscectomy  Chronic left-sided low back pain with left-sided sciatica  Joint stiffness  Muscle weakness (generalized)     Problem List There are no problems to display for this patient.  Pura Spice, PT, DPT # [phone] 06/16/2020, 4:38 PM  [place]  [address] [place], [place], [address] Phone: [phone]   Fax:  [phone]  Name: Patty Wong MRN: [id number] Date of Birth: August 03, 1965

## 2020-06-14 NOTE — Addendum Note (Signed)
Addended by: Cammie Mcgee on: 06/14/2020 02:29 PM   Modules accepted: Orders

## 2020-06-18 DIAGNOSIS — Z9889 Other specified postprocedural states: Secondary | ICD-10-CM

## 2020-06-18 DIAGNOSIS — M2560 Stiffness of unspecified joint, not elsewhere classified: Secondary | ICD-10-CM

## 2020-06-18 DIAGNOSIS — G8929 Other chronic pain: Secondary | ICD-10-CM

## 2020-06-18 DIAGNOSIS — M6281 Muscle weakness (generalized): Secondary | ICD-10-CM

## 2020-06-18 NOTE — Patient Instructions (Signed)
Access Code: [email or web address]: [email or web address]: 01/11/2022Prepared by: Casimiro Needle SherkExercises  Seated Sciatic Tensioner - 1 x daily - 7 x weekly - 1 sets - 3 reps

## 2020-06-18 NOTE — Therapy (Signed)
[place] [facility] [address]. [place], [place], [address] Phone: [phone]   Fax:  [phone]  Physical Therapy Treatment  Patient Details  Name: Patty Wong MRN: [id number] Date of Birth: [birth year] Referring Provider (PT): Elwin Sleight, DO   Encounter Date: 06/18/2020   PT End of Session - 06/18/20 1217    Visit Number 3    Number of Visits 9    Date for PT Re-Evaluation 07/09/20    Authorization - Visit Number 3    Authorization - Number of Visits 10    PT Start Time 0940    PT Stop Time 6606    PT Time Calculation (min) 54 min    Activity Tolerance Patient tolerated treatment well;No increased pain    Behavior During Therapy WFL for tasks assessed/performed           Past Medical History:  Diagnosis Date  . Anemia    while pregnant  . Anxiety   . Arthritis    back  . DVT (deep venous thrombosis) ([place]) 2014   calf- unknown which leg  . GERD (gastroesophageal reflux disease)   . Heart murmur   . History of hiatal hernia   . Hypertension   . Peripheral vascular disease ([place])   . PONV (postoperative nausea and vomiting)     Past Surgical History:  Procedure Laterality Date  . BACK SURGERY     2007, 1994  . [place]  . LUMBAR LAMINECTOMY/ DECOMPRESSION WITH MET-RX N/A 05/10/2020   Procedure: MICRODISCECTOMY LEFT LUMBAR THREE- LUMBAR FOUR;  Surgeon: Karsten Ro, DO;  Location: [place];  Service: Neurosurgery;  Laterality: N/A;  posterior  . TONSILLECTOMY     removed as a child    There were no vitals filed for this visit.   Subjective Assessment - 06/18/20 1047    Subjective Pt states her LB is feeling good today and is having no pain. Pt states that she has been getting numbness and cramps in her left second and third toes when she lies down at night.  Pt reports no increase in symptoms after last physical therapy appointment.    Pertinent History 2007: 2nd surgery (hardward/ screws in  place).  Pt. is a Audiological scientist at [employer].    Limitations Lifting;Standing;Walking;House hold activities    Patient Stated Goals Decrease low back/ L LE symptoms and return to work without limitations.    Currently in Pain? No/denies                There.ex.:  Nustep L5 10 min. B UE/LE (cuing for TrA muscle activation).    Box lifting (7#)/ carrying (7#)/ push pull (26#)- pt. Instructed in proper technique/ no increase c/o pain reported.   Nautilus: 40# standing lat. Pull downs/ 40# scap. Retraction/ 30# core walk outs with single handle 30x each.  Seated blue ex. Ball: pelvic tilts/ clocks/ marching/ alt. UE and LE/ LAQ with SBA for safety.     Manual tx.:  Supine LE stretches (generalized) with focus on hamstring/ piriformis/ trunk rotn./ sciatic nerve glides.   Issued nerve glides in sitting for HEP  Seated STM to low back (minimal tenderness)- L>R       PT Long Term Goals - 06/14/20 1420      PT LONG TERM GOAL #1   Title Pt. will increase FOTO to 45 to improve pain-free functional mobility/ return to work.    Baseline Initial  FOTO: 34    Time 4    Period Weeks    Status New    Target Date 07/09/20      PT LONG TERM GOAL #2   Title Pt. independent with HEP to increase B hip/ core strength 1/2 muscle grade to improve pain-free mobility.    Baseline Standing lumbar AROM WFL and LE strength grossly 5/5 MMT except B hip flexion 4/5 MMT (slight discomfort in low back).    Time 4    Period Weeks    Status New    Target Date 07/09/20      PT LONG TERM GOAL #3   Title Pt. will demonstrate proper floor to waist lifting/ bilateral carry with upright posture and no c/o low back pain to promote return to work.    Baseline Lifting restriction at this time.  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    Baseline Pt. currently out of work.    Time 4    Period Weeks    Status New    Target Date 07/09/20                 Plan - 06/18/20 1218    Clinical Impression Statement Pt demonstrated improved endurance today evident by good tolerance to progressed resistance on the Nustep. Improved endurance r/i good progress toward returning to work when cleared by doctor. Pt presented with increased sciatic nerve tension bilaterally and L lumbar paraspinal mm tension, which can r/i increased stress on the lumbar spine with functional activities. Pt tolerated EX well with no c/o increased pain, however required verbal cues and guarding during stability ball EX d/t decreased balance and core stability. Pt also presented with a normal gait pattern and good liftning technique with a 7# box.    Stability/Clinical Decision Making Evolving/Moderate complexity    Clinical Decision Making Moderate    Rehab Potential Good    PT Frequency 2x / week    PT Duration 4 weeks    PT Treatment/Interventions ADLs/Self Care Home Management;Cryotherapy;Electrical Stimulation;Moist Heat;Gait training;Stair training;Functional mobility training;Therapeutic activities;Neuromuscular re-education;Balance training;Therapeutic exercise;Patient/family education;Manual techniques;Passive range of motion;Dry needling    PT Next Visit Plan Progress core stability program    PT Home Exercise Plan TrA ex. program (page #1-2)           Patient will benefit from skilled therapeutic intervention in order to improve the following deficits and impairments:  Decreased activity tolerance,Decreased endurance,Decreased range of motion,Decreased strength,Hypomobility,Improper body mechanics,Pain,Obesity,Postural dysfunction,Impaired flexibility,Decreased mobility  Visit Diagnosis: Status post lumbar microdiscectomy  Chronic left-sided low back pain with  left-sided sciatica  Joint stiffness  Muscle weakness (generalized)     Problem List There are no problems to display for this patient.  Pura Spice, PT, DPT # [phone] 06/18/2020, 12:28 PM  [facility] [address] [place], [place], [address] Phone: [phone]   Fax:  [phone]  Name: Patty Wong MRN: [id number] Date of Birth: March 25, 1966

## 2020-06-21 DIAGNOSIS — G8929 Other chronic pain: Secondary | ICD-10-CM

## 2020-06-21 DIAGNOSIS — Z9889 Other specified postprocedural states: Secondary | ICD-10-CM

## 2020-06-21 DIAGNOSIS — M6281 Muscle weakness (generalized): Secondary | ICD-10-CM

## 2020-06-21 DIAGNOSIS — M2560 Stiffness of unspecified joint, not elsewhere classified: Secondary | ICD-10-CM

## 2020-06-21 NOTE — Therapy (Signed)
[facility] [facility] [address] [place], [place], [address] Phone: [phone]   Fax:  [phone]  Physical Therapy Treatment  Patient Details  Name: Patty Wong MRN: [id number] Date of Birth: [birth year] Referring Provider (PT): Elwin Sleight, DO   Encounter Date: 06/21/2020   PT End of Session - 06/21/20 1104    Visit Number 4    Number of Visits 9    Date for PT Re-Evaluation 07/09/20    Authorization - Visit Number 4    Authorization - Number of Visits 10    PT Start Time 0930    PT Stop Time 1020    PT Time Calculation (min) 50 min    Activity Tolerance Patient tolerated treatment well;No increased pain    Behavior During Therapy WFL for tasks assessed/performed           Past Medical History:  Diagnosis Date  . Anemia    while pregnant  . Anxiety   . Arthritis    back  . DVT (deep venous thrombosis) ([place]) 2014   calf- unknown which leg  . GERD (gastroesophageal reflux disease)   . Heart murmur   . History of hiatal hernia   . Hypertension   . Peripheral vascular disease ([place])   . PONV (postoperative nausea and vomiting)     Past Surgical History:  Procedure Laterality Date  . BACK SURGERY     2007, 1994  . [place]  . LUMBAR LAMINECTOMY/ DECOMPRESSION WITH MET-RX N/A 05/10/2020   Procedure: MICRODISCECTOMY LEFT LUMBAR THREE- LUMBAR FOUR;  Surgeon: Karsten Ro, DO;  Location: [place];  Service: Neurosurgery;  Laterality: N/A;  posterior  . TONSILLECTOMY     removed as a child    There were no vitals filed for this visit.   Subjective Assessment - 06/21/20 0937    Subjective Pt reports that the manual stretching last appointment caused some irritation of her c-section scar.  Pt states she is having less than 1/10 pain in her LB and L leg today. Pt states she can probably stand for 1 hour before she needs to take a break. Pt states when she goes back to work she will need to lift items from  the ground or s shelf. Right now the heaviest thing she lifts now is 10-15# (wet laundry).    Pertinent History 2007: 2nd surgery (hardward/ screws in place).  Pt. is a Audiological scientist at [employer].    Limitations Lifting;Standing;Walking;House hold activities    Patient Stated Goals Decrease low back/ L LE symptoms and return to work without limitations.    Currently in Pain? Yes    Pain Score 1     Pain Location Back    Pain Orientation Left             There.ex.:  Nustep Level 6 (increased from last visit) 10 min. B UE/LE seat #6   Static TA 5x5"  TA with pelvic tilt 2x10 with 5" hold  TA with supine march x10 each side  TA with bridge 2x10  Neuro.:  Box lifting with 5# in box onto waist height shelf x15 (increased from last visit) pt. Instructed in proper technique/ no increase c/o pain reported.   Nautilus: 40# scap. Retraction/ 30# core side step  walk outs with single handle 10x each.    Manual tx.:  Sidelying STM to B lumbar paraspinals and B QL. Increased TTP noted.  PT Long Term Goals - 06/14/20 1420      PT LONG TERM GOAL #1   Title Pt. will increase FOTO to 45 to improve pain-free functional mobility/ return to work.    Baseline Initial FOTO: 34    Time 4    Period Weeks    Status New    Target Date 07/09/20      PT LONG TERM GOAL #2   Title Pt. independent with HEP to increase B hip/ core strength 1/2 muscle grade to improve pain-free mobility.    Baseline Standing lumbar AROM WFL and LE strength grossly 5/5 MMT except B hip flexion 4/5 MMT (slight discomfort in low back).    Time 4    Period Weeks    Status New    Target Date 07/09/20      PT LONG TERM GOAL #3   Title Pt. will demonstrate proper floor to waist lifting/ bilateral carry with upright posture and no c/o low back pain to promote return to work.    Baseline Lifting restriction at this time.  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    Baseline Pt. currently out of work.    Time 4    Period Weeks    Status New    Target Date 07/09/20              Plan - 06/21/20 0946    Clinical Impression Statement Pt tolerated progression of Nustep and exercises today with no c/o increased pain, but increased fatigue d/t decreased overall endurance. Pt presents with increased tension and tenderness of the B QL and lumbar paraspinals evident during manual STM and r/i increased LBP. Increased LBP and decreased endurance and strength r/i pt having difficulty with lifting tasks and prolonged standing. Pt reported no increased pain with progressed ex today.    Stability/Clinical Decision Making Evolving/Moderate complexity    Clinical Decision Making Moderate    Rehab Potential Good    PT Frequency 2x / week    PT Duration 4 weeks    PT Treatment/Interventions ADLs/Self Care Home Management;Cryotherapy;Electrical Stimulation;Moist Heat;Gait training;Stair training;Functional mobility training;Therapeutic activities;Neuromuscular re-education;Balance training;Therapeutic exercise;Patient/family education;Manual techniques;Passive range of motion;Dry needling    PT Next Visit Plan Continue to progress core and LE strength to progress lifting and prolonged standing abilities to progress to safe RTW per MD orders.           Patient will benefit from skilled therapeutic intervention in order to improve the following deficits and impairments:  Decreased activity tolerance,Decreased endurance,Decreased range of motion,Decreased strength,Hypomobility,Improper body mechanics,Pain,Obesity,Postural dysfunction,Impaired flexibility,Decreased mobility  Visit Diagnosis: Status post lumbar microdiscectomy  Chronic left-sided low back pain with left-sided  sciatica  Joint stiffness  Muscle weakness (generalized)     Problem List There are no problems to display for this patient.  Pura Spice, PT, DPT # [id number] Kayleen Memos, SPT 06/21/2020, 11:10 AM  [place] [facility] [address] [place], [place], [address] Phone: [phone]   Fax:  [phone]  Name: Patty Wong MRN: [id number] Date of Birth: 26-Oct-1965

## 2020-06-28 DIAGNOSIS — Z9889 Other specified postprocedural states: Secondary | ICD-10-CM | POA: Diagnosis not present

## 2020-06-28 DIAGNOSIS — M2560 Stiffness of unspecified joint, not elsewhere classified: Secondary | ICD-10-CM

## 2020-06-28 DIAGNOSIS — G8929 Other chronic pain: Secondary | ICD-10-CM

## 2020-06-28 DIAGNOSIS — M6281 Muscle weakness (generalized): Secondary | ICD-10-CM

## 2020-06-28 NOTE — Therapy (Signed)
[place] [facility] [address] [place], [place], [address] Phone: [phone]   Fax:  [phone]  Physical Therapy Treatment  Patient Details  Name: Patty Wong MRN: [id number] Date of Birth: 01/25/1966 Referring Provider (PT): Elwin Sleight, DO   Encounter Date: 06/28/2020   PT End of Session - 06/28/20 1104    Visit Number 5    Number of Visits 9    Date for PT Re-Evaluation 07/09/20    Authorization - Visit Number 5    Authorization - Number of Visits 10    PT Start Time 0932    PT Stop Time 1024    PT Time Calculation (min) 52 min    Activity Tolerance Patient tolerated treatment well    Behavior During Therapy [facility] for tasks assessed/performed           Past Medical History:  Diagnosis Date  . Anemia    while pregnant  . Anxiety   . Arthritis    back  . DVT (deep venous thrombosis) ([place]) 2014   calf- unknown which leg  . GERD (gastroesophageal reflux disease)   . Heart murmur   . History of hiatal hernia   . Hypertension   . Peripheral vascular disease ([place])   . PONV (postoperative nausea and vomiting)     Past Surgical History:  Procedure Laterality Date  . BACK SURGERY     2007, 1994  . [place]  . LUMBAR LAMINECTOMY/ DECOMPRESSION WITH MET-RX N/A 05/10/2020   Procedure: MICRODISCECTOMY LEFT LUMBAR THREE- LUMBAR FOUR;  Surgeon: Karsten Ro, DO;  Location: [place];  Service: Neurosurgery;  Laterality: N/A;  posterior  . TONSILLECTOMY     removed as a child    There were no vitals filed for this visit.   Subjective Assessment - 06/28/20 0935    Subjective Pt states she is having less than 1/10 pain in her LB and L leg today and that her scar that was irritated a couple weeks ago has healed. Pt reports she has no issues with prolonged sitting at home. Pt reports having some difficulty with vacuuming and she has not tried mopping yet.    Pertinent History 2007: 2nd surgery (hardward/ screws  in place).  Pt. is a Audiological scientist at [employer].    Limitations Lifting;Standing;Walking;House hold activities    Patient Stated Goals Decrease low back/ L LE symptoms and return to work without limitations.    Currently in Pain? Yes    Pain Score 1     Pain Location Back           There.ex.:  Nustep Level 6 (increase next visit)10 min. B UE/LE seat #6   Static TA 5x5"  TA with pelvic tilt 2x10 with 5" hold   TA with bridge 2x10 SL clams x15 each side (increase reps next visit)   Neuro.:  Box liftingwith 10# in wide grip box onto waist height shelf x15 and pivot to chair. Instructed in proper technique/ no increase c/o pain reported.  Standing hip abd with red band around knees and B UE support .   Nautilus: 40# scap. Retraction 2x10/ 20# dead lifts 2x10    Manual tx.:  Sidelying STM to B lumbar paraspinals and B QL. Increased TTP noted.         PT Long Term Goals - 06/14/20 1420      PT LONG TERM GOAL #1   Title Pt. will  increase FOTO to 45 to improve pain-free functional mobility/ return to work.    Baseline Initial FOTO: 34    Time 4    Period Weeks    Status New    Target Date 07/09/20      PT LONG TERM GOAL #2   Title Pt. independent with HEP to increase B hip/ core strength 1/2 muscle grade to improve pain-free mobility.    Baseline Standing lumbar AROM WFL and LE strength grossly 5/5 MMT except B hip flexion 4/5 MMT (slight discomfort in low back).    Time 4    Period Weeks    Status New    Target Date 07/09/20      PT LONG TERM GOAL #3   Title Pt. will demonstrate proper floor to waist lifting/ bilateral carry with upright posture and no c/o low back pain to promote return to work.    Baseline Lifting restriction at this time.  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    Baseline Pt. currently out of work.    Time 4    Period Weeks    Status New    Target Date 07/09/20                 Plan - 06/28/20 1027    Clinical Impression Statement Progressed exercises today and pt tolerated well with no c/o increased pain, but reported increased fatigue d/t deficits in endurance and LE and core strength. Pt required frequent verbal cues for improved lifting form. However, pt is demoing improvements in endurance and strength evident by good tolerance to progressed exercises and resulting in progression toward safe RTW.    Stability/Clinical Decision Making Evolving/Moderate complexity    Clinical Decision Making Moderate    Rehab Potential Good    PT Frequency 2x / week    PT Duration 4 weeks    PT Treatment/Interventions ADLs/Self Care Home Management;Cryotherapy;Electrical Stimulation;Moist Heat;Gait training;Stair training;Functional mobility training;Therapeutic activities;Neuromuscular re-education;Balance training;Therapeutic exercise;Patient/family education;Manual techniques;Passive range of motion;Dry needling    PT Next Visit Plan Continue to progress core and LE strength to progress lifting and prolonged standing abilities to progress to safe RTW per MD orders.  SEND MD NOTE NEXT TX    PT Home Exercise Plan Added clamshells and standing hip abd today           Patient will benefit from skilled therapeutic intervention in order to improve the following deficits and impairments:  Decreased activity tolerance,Decreased endurance,Decreased range of motion,Decreased strength,Hypomobility,Improper body mechanics,Pain,Obesity,Postural dysfunction,Impaired flexibility,Decreased mobility  Visit Diagnosis: Status post lumbar microdiscectomy  Chronic left-sided low back pain with left-sided sciatica  Joint stiffness  Muscle weakness (generalized)     Problem List There are no problems  to display for this patient.  Pura Spice, PT, DPT # [phone] Kayleen Memos SPT  06/28/2020, 12:41 PM  [facility] [address] [place], [place], [address] Phone: [phone]   Fax:  [phone]  Name: Patty Wong MRN: [id number] Date of Birth: 1965/08/24

## 2020-06-28 NOTE — Patient Instructions (Signed)
Access Code: [email or web address]: [email or web address]: 01/21/2022Prepared by: Casimiro Needle SherkExercises  Seated Slump Neural Tensioner - 1 x daily - 7 x weekly - 3 sets - 10 reps  Clamshell - 1 x daily - 7 x weekly - 3 sets - 10 reps  Standing Hip Abduction with Resistance at Ankles and Unilateral Counter Support - 1 x daily - 7 x weekly - 3 sets - 10 reps

## 2020-07-02 DIAGNOSIS — M6281 Muscle weakness (generalized): Secondary | ICD-10-CM

## 2020-07-02 DIAGNOSIS — M2560 Stiffness of unspecified joint, not elsewhere classified: Secondary | ICD-10-CM

## 2020-07-02 DIAGNOSIS — Z9889 Other specified postprocedural states: Secondary | ICD-10-CM | POA: Diagnosis not present

## 2020-07-02 NOTE — Therapy (Signed)
[facility] [address] [place], [place], [address] Phone: [phone]   Fax:  [phone]  Physical Therapy Treatment  Patient Details  Name: Patty Wong MRN: [id number] Date of Birth: 10-09-1965 Referring Provider (PT): Elwin Sleight, DO   Encounter Date: 07/02/2020   PT End of Session - 07/02/20 1146    Visit Number 6    Number of Visits 9    Date for PT Re-Evaluation 07/09/20    Authorization - Visit Number 6    Authorization - Number of Visits 10    PT Start Time 1025    PT Stop Time 1114    PT Time Calculation (min) 49 min    Activity Tolerance Patient tolerated treatment well    Behavior During Therapy [facility] for tasks assessed/performed           Past Medical History:  Diagnosis Date  . Anemia    while pregnant  . Anxiety   . Arthritis    back  . DVT (deep venous thrombosis) ([place]) 2014   calf- unknown which leg  . GERD (gastroesophageal reflux disease)   . Heart murmur   . History of hiatal hernia   . Hypertension   . Peripheral vascular disease ([place])   . PONV (postoperative nausea and vomiting)     Past Surgical History:  Procedure Laterality Date  . BACK SURGERY     2007, 1994  . [place]  . LUMBAR LAMINECTOMY/ DECOMPRESSION WITH MET-RX N/A 05/10/2020   Procedure: MICRODISCECTOMY LEFT LUMBAR THREE- LUMBAR FOUR;  Surgeon: Karsten Ro, DO;  Location: [place];  Service: Neurosurgery;  Laterality: N/A;  posterior  . TONSILLECTOMY     removed as a child    There were no vitals filed for this visit.   Subjective Assessment - 07/02/20 1028    Subjective Pt states she is having less than 1/10 pain in her LB and L leg today, but has increased numbness in the last three toes on the L foot. Pt states her inner thighs were pretty sore after last appointment, but it went away. Pt states she's been doing her HEP at home. Pt states she goes to the doctor tomorrow to see if she will be able to go  back to work.    Pertinent History 2007: 2nd surgery (hardward/ screws in place).  Pt. is a Audiological scientist at [employer].    Limitations Lifting;Standing;Walking;House hold activities    Patient Stated Goals Decrease low back/ L LE symptoms and return to work without limitations.    Currently in Pain? Yes    Pain Score 1     Pain Location Back    Pain Orientation Lower           There.ex.:  Nustep Level 610 min. B UE/LEseat #6    TA with br idge 2x10 SL clams 2x20 each side (increased reps this visit)   Seated 3 way ball rolls x8 each way  STS with 6# dumbbell x10  Nautilus:  30# side step with push/pull x10 each side  50# high row 2x10/ 40# dead lifts 2x10 (weight increase from last visit)   Neuro.:  Box liftingwith 10# in wide grip box onto waist height shelf x15 and pivot to chair. Instructed in proper technique/ no increase c/o pain reported.   Next visit: SLR and standing resisted hip flexion    Manual tx.:  Sidelying STM to B lumbar paraspinals and  B QL. Increased TTP noted      PT Long Term Goals - 06/14/20 1420      PT LONG TERM GOAL #1   Title Pt. will increase FOTO to 45 to improve pain-free functional mobility/ return to work.    Baseline Initial FOTO: 34    Time 4    Period Weeks    Status New    Target Date 07/09/20      PT LONG TERM GOAL #2   Title Pt. independent with HEP to increase B hip/ core strength 1/2 muscle grade to improve pain-free mobility.    Baseline Standing lumbar AROM WFL and LE strength grossly 5/5 MMT except B hip flexion 4/5 MMT (slight discomfort in low back).    Time 4    Period Weeks    Status New    Target Date 07/09/20      PT LONG TERM GOAL #3   Title Pt. will demonstrate proper floor to waist lifting/ bilateral carry with upright posture and no c/o low back pain to promote return to work.    Baseline Lifting restriction at this time.  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    Baseline Pt. currently out of work.    Time 4    Period Weeks    Status New    Target Date 07/09/20                 Plan - 07/02/20 1121    Clinical Impression Statement Pt continues to tolerate progression of exercises well with no c/o increased pain and minimal fatigue. Pt also requires less verbal cues and edu on lifting and squat mechanics due to improvements from last visit and resulting in decreased stress on the spine with functional lifting tasks. Pt demos decreased B hip flexion MMT today (4/5).    Stability/Clinical Decision Making Evolving/Moderate complexity    Clinical Decision Making Moderate    Rehab Potential Good    PT Frequency 2x / week    PT Duration 4 weeks    PT Treatment/Interventions ADLs/Self Care Home Management;Cryotherapy;Electrical Stimulation;Moist Heat;Gait training;Stair training;Functional mobility training;Therapeutic activities;Neuromuscular re-education;Balance training;Therapeutic exercise;Patient/family education;Manual techniques;Passive range of motion;Dry needling    PT Next Visit Plan Continue to progress core and LE strength to progress lifting and prolonged standing abilities to progress to safe RTW per MD orders.    PT Home Exercise Plan Continue with current plan           Patient will benefit from skilled therapeutic intervention in order to improve the following deficits and impairments:  Decreased activity tolerance,Decreased endurance,Decreased range of motion,Decreased strength,Hypomobility,Improper body mechanics,Pain,Obesity,Postural dysfunction,Impaired flexibility,Decreased mobility  Visit Diagnosis: Status post lumbar microdiscectomy  Muscle weakness (generalized)  Joint stiffness     Problem List There are no  problems to display for this patient.  Kayleen Memos SPT  Pura Spice, PT, DPT # [id number] 07/03/2020, 11:33 AM  [facility] [address] [place], [place], [address] Phone: [phone]   Fax:  [phone]  Name: Torrey Horseman MRN: [id number] Date of Birth: 04-11-1966

## 2020-07-05 DIAGNOSIS — M5442 Lumbago with sciatica, left side: Secondary | ICD-10-CM

## 2020-07-05 DIAGNOSIS — M2560 Stiffness of unspecified joint, not elsewhere classified: Secondary | ICD-10-CM

## 2020-07-05 DIAGNOSIS — G8929 Other chronic pain: Secondary | ICD-10-CM

## 2020-07-05 DIAGNOSIS — Z9889 Other specified postprocedural states: Secondary | ICD-10-CM

## 2020-07-05 DIAGNOSIS — M6281 Muscle weakness (generalized): Secondary | ICD-10-CM

## 2020-07-05 NOTE — Therapy (Signed)
[facility] [address] [place], [place], [address] Phone: [phone]   Fax:  [phone]  Physical Therapy Treatment  Patient Details  Name: Patty Wong MRN: [id number] Date of Birth: January 28, 1966 Referring Provider (PT): Elwin Sleight, DO   Encounter Date: 07/05/2020   PT End of Session - 07/05/20 1100    Visit Number 7    Number of Visits 9    Date for PT Re-Evaluation 07/09/20    Authorization - Visit Number 7    Authorization - Number of Visits 10    PT Start Time 0930    PT Stop Time 1026    PT Time Calculation (min) 56 min    Activity Tolerance Patient tolerated treatment well    Behavior During Therapy [facility] for tasks assessed/performed           Past Medical History:  Diagnosis Date  . Anemia    while pregnant  . Anxiety   . Arthritis    back  . DVT (deep venous thrombosis) ([place]) 2014   calf- unknown which leg  . GERD (gastroesophageal reflux disease)   . Heart murmur   . History of hiatal hernia   . Hypertension   . Peripheral vascular disease ([place])   . PONV (postoperative nausea and vomiting)     Past Surgical History:  Procedure Laterality Date  . BACK SURGERY     2007, 1994  . [place]  . LUMBAR LAMINECTOMY/ DECOMPRESSION WITH MET-RX N/A 05/10/2020   Procedure: MICRODISCECTOMY LEFT LUMBAR THREE- LUMBAR FOUR;  Surgeon: Karsten Ro, DO;  Location: [place];  Service: Neurosurgery;  Laterality: N/A;  posterior  . TONSILLECTOMY     removed as a child    There were no vitals filed for this visit.   Subjective Assessment - 07/05/20 0934    Subjective Pt states she saw her doctor yesterday and he is clearing her to go back to work on 07/22/20 with a 10 pound lifting limit and a sit break every 2 hours. Pt can occcasionally bend, squat, kneel, crouch during work per MD.  Pt reports she is having no pain today.    Pertinent History 2007: 2nd surgery (hardward/ screws in place).  Pt. is a  Audiological scientist at [employer].    Limitations Lifting;Standing;Walking;House hold activities    Patient Stated Goals Decrease low back/ L LE symptoms and return to work without limitations.    Currently in Pain? No/denies          There.ex.:  TM walking 2.2 mph x10 min (added today)   TA with bridge 2x15 (progressed today)  TA with SLR 2x15 (added today) SL hip abd 2x10 (added today)  Seated 3 way ball rolls x10 each way  STS with 9# dumbbell x18 (progressed today)   // bar mini lunges x10 bilaterally   Nautilus:  30# side step with push/pull x10 each side  50# high row2x10/40# dead lifts x25 (weight increase from last visit)     Next visit:  standing resisted hip flexion      PT Long Term Goals - 06/14/20 1420      PT LONG TERM GOAL #1   Title Pt. will increase FOTO to 45 to improve pain-free functional mobility/ return to work.    Baseline Initial FOTO: 34    Time 4    Period Weeks    Status New    Target Date 07/09/20  PT LONG TERM GOAL #2   Title Pt. independent with HEP to increase B hip/ core strength 1/2 muscle grade to improve pain-free mobility.    Baseline Standing lumbar AROM WFL and LE strength grossly 5/5 MMT except B hip flexion 4/5 MMT (slight discomfort in low back).    Time 4    Period Weeks    Status New    Target Date 07/09/20      PT LONG TERM GOAL #3   Title Pt. will demonstrate proper floor to waist lifting/ bilateral carry with upright posture and no c/o low back pain to promote return to work.    Baseline Lifting restriction at this time.  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    Baseline Pt. currently out of work.    Time 4    Period Weeks    Status New    Target Date 07/09/20                  Plan - 07/05/20 1104    Clinical Impression Statement Assessed pts gait mechanics on TM today and no abnormalities were noted. Progressed exercises today and pt reported no increased pain, but increased fatigue. Added lunges today to progress to improved tolerance to kneeling and pt struggled with form and technique and required verbal and visual cues for improvements and pt    Stability/Clinical Decision Making Evolving/Moderate complexity    Rehab Potential Good    PT Frequency 2x / week    PT Duration 4 weeks    PT Treatment/Interventions ADLs/Self Care Home Management;Cryotherapy;Electrical Stimulation;Moist Heat;Gait training;Stair training;Functional mobility training;Therapeutic activities;Neuromuscular re-education;Balance training;Therapeutic exercise;Patient/family education;Manual techniques;Passive range of motion;Dry needling    PT Next Visit Plan Continue to progress core and LE strength to progress lifting and prolonged standing abilities to progress to safe RTW per MD orders.    PT Home Exercise Plan Continue with current plan           Patient will benefit from skilled therapeutic intervention in order to improve the following deficits and impairments:  Decreased activity tolerance,Decreased endurance,Decreased range of motion,Decreased strength,Hypomobility,Improper body mechanics,Pain,Obesity,Postural dysfunction,Impaired flexibility,Decreased mobility  Visit Diagnosis: Status post lumbar microdiscectomy  Muscle weakness (generalized)  Joint stiffness  Chronic left-sided low back pain with left-sided sciatica     Problem List There are no problems to display for this patient.  Pura Spice, PT, DPT # [id number] Kayleen Memos SPT  07/05/2020, 11:34 AM  [facility] [address] [place], [place], [address] Phone: [phone]   Fax:  [phone]  Name: Markesia Crilly MRN: [id number] Date of Birth:  1965-11-29

## 2020-07-12 DIAGNOSIS — M6281 Muscle weakness (generalized): Secondary | ICD-10-CM | POA: Diagnosis present

## 2020-07-12 DIAGNOSIS — M5442 Lumbago with sciatica, left side: Secondary | ICD-10-CM | POA: Diagnosis present

## 2020-07-12 DIAGNOSIS — G8929 Other chronic pain: Secondary | ICD-10-CM | POA: Diagnosis present

## 2020-07-12 DIAGNOSIS — M2560 Stiffness of unspecified joint, not elsewhere classified: Secondary | ICD-10-CM

## 2020-07-12 DIAGNOSIS — Z9889 Other specified postprocedural states: Secondary | ICD-10-CM | POA: Diagnosis not present

## 2020-07-12 NOTE — Therapy (Signed)
[facility] [address] [place], [place], [address] Phone: [phone]   Fax:  [phone]  Physical Therapy Treatment  Patient Details  Name: Patty Wong MRN: [id number] Date of Birth: 08-16-1965 Referring Provider (PT): Elwin Sleight, DO   Encounter Date: 07/12/2020   PT End of Session - 07/12/20 1007    Visit Number 8    Number of Visits 15    Date for PT Re-Evaluation 08/09/20    Authorization - Visit Number 8    Authorization - Number of Visits 10    PT Start Time 0925    PT Stop Time 1013    PT Time Calculation (min) 48 min    Activity Tolerance Patient tolerated treatment well    Behavior During Therapy [facility] for tasks assessed/performed           Past Medical History:  Diagnosis Date  . Anemia    while pregnant  . Anxiety   . Arthritis    back  . DVT (deep venous thrombosis) ([place]) 2014   calf- unknown which leg  . GERD (gastroesophageal reflux disease)   . Heart murmur   . History of hiatal hernia   . Hypertension   . Peripheral vascular disease ([place])   . PONV (postoperative nausea and vomiting)     Past Surgical History:  Procedure Laterality Date  . BACK SURGERY     2007, 1994  . [place]  . LUMBAR LAMINECTOMY/ DECOMPRESSION WITH MET-RX N/A 05/10/2020   Procedure: MICRODISCECTOMY LEFT LUMBAR THREE- LUMBAR FOUR;  Surgeon: Karsten Ro, DO;  Location: [place];  Service: Neurosurgery;  Laterality: N/A;  posterior  . TONSILLECTOMY     removed as a child    There were no vitals filed for this visit.    Subjective Assessment - 07/12/20 0953    Subjective Pt states she's been sick this week so has not done her exercises (negative covid test). Pt states she's in no pain today. Pt states she is tentatively returning to work on 07/22/20.  She states her inner thighs were sore from last visit, but nothing unmanageable.    Pertinent History 2007: 2nd surgery (hardward/ screws in place).  Pt.  is a Audiological scientist at [employer].    Limitations Lifting;Standing;Walking;House hold activities    How long can you sit comfortably? 1-2 hours - pt could not sit for more than 5-10 minutes post surgery.    How long can you stand comfortably? 1 hour    How long can you walk comfortably? 1 hour    Patient Stated Goals Return to work without limitations and pt would like to be able to do yard work.    Currently in Pain? No/denies           There.ex.:  TM walking 2.2 mph x12 min (increased today)   Push/pull sled - 15# on sled x5 laps (added today)   Seated 3 way ball rolls x10 each way  STS with 9# dumbbell x20 (progressed today)   Nautilus: 30# side step with push/pull x10 each side  50#high row2x10/40# dead lifts x25(weight increase from last visit)  MMT  Hip flexion:  4/5 L hip  4/5 R hip      [facility] PT Assessment - 07/12/20 0001      Assessment   Medical Diagnosis Radiculopathy, lumbar region.  S/p microdiscectomy    Referring Provider (PT) Pieter Partridge Dawley, DO    Onset  Date/Surgical Date 05/10/20    Prior Therapy Not recently for low back      Precautions   Precautions Back      Prior Function   Level of Independence Independent      Cognition   Overall Cognitive Status Within Functional Limits for tasks assessed              PT Long Term Goals - 07/12/20 1022      PT LONG TERM GOAL #1   Title Pt. will increase FOTO to 45 to improve pain-free functional mobility/ return to work.    Baseline Initial FOTO: 34, 07/12/20 score: 59    Time 4    Period Weeks    Status Achieved    Target Date 07/12/20      PT LONG TERM GOAL #2   Title Pt. independent with HEP to increase B hip/ core strength 1/2 muscle grade to improve pain-free mobility.    Baseline Standing lumbar AROM WFL and LE strength grossly 5/5 MMT except B hip flexion 4/5 MMT (slight discomfort in low back). 07/12/20: indepedent with HEP, 4/5 B hip flexion MMT    Time 4    Period Weeks     Status Partially Met    Target Date 08/09/20      PT LONG TERM GOAL #3   Title Pt. will demonstrate proper floor to waist lifting/ bilateral carry with upright posture and no c/o low back pain to promote return to work.    Baseline Lifting restriction at this time.  Increase low back discomfort/ L LE radicular symptoms with increase activity. 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    Baseline Pt. currently out of work. 07/12/20: starting work tentitively on 07/22/20    Time 4    Period Weeks    Status On-going    Target Date 08/09/20      PT LONG TERM GOAL #5   Title Pt will be able to complete yard work  with no increase c/o back pain.    Baseline 07/12/20: unable/ has not tried    Time 4    Period Weeks    Status New    Target Date 08/09/20              Plan - 07/12/20 1027    Clinical Impression Statement Pt has achieved or partially met HEP goal and lifting goal as compared to at [facility] r/i progression to safe RTW. Pt continues to demo improvements in strength, posture, and endurance evident by good tolerance to progressed exercises and resulting in increased stability of the lumbar spine, improvements in ADLs, and progress toward safe RTW. Added push/pull exercise today and pt did well with no c/o increased pain.    Stability/Clinical Decision Making Evolving/Moderate complexity    Clinical Decision Making Moderate    Rehab Potential Good    PT Frequency 2x / week    PT Duration 4 weeks    PT Treatment/Interventions ADLs/Self Care Home Management;Cryotherapy;Electrical Stimulation;Moist Heat;Gait training;Stair training;Functional mobility training;Therapeutic activities;Neuromuscular re-education;Balance training;Therapeutic exercise;Patient/family education;Manual  techniques;Passive range of motion;Dry needling    PT Next Visit Plan assess tolerance to progressed exercises    PT  Home Exercise Plan Continue with current plan           Patient will benefit from skilled therapeutic intervention in order to improve the following deficits and impairments:  Decreased activity tolerance,Decreased endurance,Decreased range of motion,Decreased strength,Hypomobility,Improper body mechanics,Pain,Obesity,Postural dysfunction,Impaired flexibility,Decreased mobility  Visit Diagnosis: Status post lumbar microdiscectomy  Muscle weakness (generalized)  Joint stiffness     Problem List There are no problems to display for this patient.  Pura Spice, PT, DPT # [id number] Kayleen Memos, SPT 07/12/2020, 11:56 AM  [facility] [address] [place], [place], [address] Phone: [phone]   Fax:  [phone]  Name: Patty Wong MRN: [id number] Date of Birth: 25-Jun-1965

## 2020-07-16 DIAGNOSIS — M2560 Stiffness of unspecified joint, not elsewhere classified: Secondary | ICD-10-CM

## 2020-07-16 DIAGNOSIS — Z9889 Other specified postprocedural states: Secondary | ICD-10-CM | POA: Diagnosis not present

## 2020-07-16 DIAGNOSIS — M6281 Muscle weakness (generalized): Secondary | ICD-10-CM

## 2020-07-16 DIAGNOSIS — G8929 Other chronic pain: Secondary | ICD-10-CM

## 2020-07-16 DIAGNOSIS — M5442 Lumbago with sciatica, left side: Secondary | ICD-10-CM

## 2020-07-16 NOTE — Patient Instructions (Signed)
Access Code: [email or web address]: [email or web address]: 02/08/2022Prepared by: Casimiro Needle SherkExercises  Side Stepping with Resistance at Thighs - 1 x daily - 4 x weekly - 2 sets - 10 reps  Forward Backward Monster Walk with Band at Thighs and Counter Support - 1 x daily - 4 x weekly - 2 sets - 10 reps

## 2020-07-16 NOTE — Therapy (Signed)
[place] [facility] [address] [place], [place], [address] Phone: [phone]   Fax:  [phone]  Physical Therapy Treatment  Patient Details  Name: Patty Wong MRN: [id number] Date of Birth: 06/20/1965 Referring Provider (PT): Elwin Sleight, DO   Encounter Date: 07/16/2020   PT End of Session - 07/16/20 1205    Visit Number 9    Number of Visits 15    Date for PT Re-Evaluation 08/09/20    Authorization - Visit Number 9    Authorization - Number of Visits 10    PT Start Time 9892    PT Stop Time 1114    PT Time Calculation (min) 46 min    Activity Tolerance Patient tolerated treatment well    Behavior During Therapy [facility] for tasks assessed/performed           Past Medical History:  Diagnosis Date  . Anemia    while pregnant  . Anxiety   . Arthritis    back  . DVT (deep venous thrombosis) ([place]) 2014   calf- unknown which leg  . GERD (gastroesophageal reflux disease)   . Heart murmur   . History of hiatal hernia   . Hypertension   . Peripheral vascular disease ([place])   . PONV (postoperative nausea and vomiting)     Past Surgical History:  Procedure Laterality Date  . BACK SURGERY     2007, 1994  . [place]  . LUMBAR LAMINECTOMY/ DECOMPRESSION WITH MET-RX N/A 05/10/2020   Procedure: MICRODISCECTOMY LEFT LUMBAR THREE- LUMBAR FOUR;  Surgeon: Karsten Ro, DO;  Location: [place];  Service: Neurosurgery;  Laterality: N/A;  posterior  . TONSILLECTOMY     removed as a child    There were no vitals filed for this visit.   Subjective Assessment - 07/16/20 1037    Subjective Pt states she is having no pain this morning. Pt reports she vacuumed the whole house this weekend, it took about 4 hours, and she had increased pain, about 4/10, but it went away after a couple hours of resting. The pain was just in the L side of the LB and did not radiate anywhere.    Currently in Pain? No/denies             There.ex.:  TM walking 2.4 mph x12 min (increased speed today and took subjective and assessed gait during)  Push/pull sled - 25# on sled x5 laps (added weight today)   Seated 3 way ball rolls x10each way   STS with9# dumbbellx20   Lunge onto BOSU in // bars with static hold in lunge position for 3 sec with minimal UE support   Nautilus: 30# side step with push/pull x10 each side  50#high row2x10/40# dead liftsx25(weight increase from last visit)  Added to HEP and performed today  Side stepping - GTB  Monster walks - GTB         PT Long Term Goals - 07/12/20 1022      PT LONG TERM GOAL #1   Title Pt. will increase FOTO to 45 to improve pain-free functional mobility/ return to work.    Baseline Initial FOTO: 34, 07/12/20 score: 59    Time 4    Period Weeks    Status Achieved    Target Date 07/12/20      PT LONG TERM GOAL #2   Title Pt. independent with HEP to increase B hip/ core strength 1/2 muscle  grade to improve pain-free mobility.    Baseline Standing lumbar AROM WFL and LE strength grossly 5/5 MMT except B hip flexion 4/5 MMT (slight discomfort in low back). 07/12/20: indepedent with HEP, 4/5 B hip flexion MMT    Time 4    Period Weeks    Status Partially Met    Target Date 08/09/20      PT LONG TERM GOAL #3   Title Pt. will demonstrate proper floor to waist lifting/ bilateral carry with upright posture and no c/o low back pain to promote return to work.    Baseline Lifting restriction at this time.  Increase low back discomfort/ L LE radicular symptoms with increase activity. 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    Baseline Pt. currently out of work. 07/12/20: starting work tentitively on 07/22/20    Time 4    Period Weeks    Status  On-going    Target Date 08/09/20      PT LONG TERM GOAL #5   Title Pt will be able to complete yard work  with no increase c/o back pain.    Baseline 07/12/20: unable/ has not tried    Time 4    Period Weeks    Status New    Target Date 08/09/20                 Plan - 07/16/20 1207    Clinical Impression Statement Progressed and added exercises today to further dynamic core and LE strength and stability and pt tolerated well with no c/o increased pain and good form. Added to HEP today to further progress strength and stability outside of therapy. Progress in LE and core strength and endurance result in progress toward safe RTW.    Stability/Clinical Decision Making Evolving/Moderate complexity    Clinical Decision Making Moderate    Rehab Potential Good    PT Frequency 2x / week    PT Duration 4 weeks    PT Treatment/Interventions ADLs/Self Care Home Management;Cryotherapy;Electrical Stimulation;Moist Heat;Gait training;Stair training;Functional mobility training;Therapeutic activities;Neuromuscular re-education;Balance training;Therapeutic exercise;Patient/family education;Manual techniques;Passive range of motion;Dry needling    PT Next Visit Plan assess tolerance to progressed exercises    PT Home Exercise Plan Continue with current plan           Patient will benefit from skilled therapeutic intervention in order to improve the following deficits and impairments:  Decreased activity tolerance,Decreased endurance,Decreased range of motion,Decreased strength,Hypomobility,Improper body mechanics,Pain,Obesity,Postural dysfunction,Impaired flexibility,Decreased mobility  Visit Diagnosis: Status post lumbar microdiscectomy  Muscle weakness (generalized)  Joint stiffness  Chronic left-sided low back pain with left-sided sciatica     Problem List There are no problems to display for this patient.   Pura Spice, PT, DPT # [id number] Kayleen Memos SPT 07/16/2020, 6:48  PM  [facility] [address] [place], [place], [address] Phone: [phone]   Fax:  [phone]  Name: Patty Wong MRN: [id number] Date of Birth: 1965-07-23

## 2021-05-14 DIAGNOSIS — M5416 Radiculopathy, lumbar region: Secondary | ICD-10-CM

## 2021-05-16 DIAGNOSIS — M5416 Radiculopathy, lumbar region: Secondary | ICD-10-CM | POA: Diagnosis present

## 2021-06-26 DIAGNOSIS — M7989 Other specified soft tissue disorders: Secondary | ICD-10-CM

## 2021-06-26 NOTE — Progress Notes (Signed)
Virtual Visit Consent   Patty Wong, you are scheduled for a virtual visit with a [facility] provider today.     Just as with appointments in the office, your consent must be obtained to participate.  Your consent will be active for this visit and any virtual visit you may have with one of our providers in the next 365 days.     If you have a MyChart account, a copy of this consent can be sent to you electronically.  All virtual visits are billed to your insurance company just like a traditional visit in the office.    As this is a virtual visit, video technology does not allow for your provider to perform a traditional examination.  This may limit your provider's ability to fully assess your condition.  If your provider identifies any concerns that need to be evaluated in person or the need to arrange testing (such as labs, EKG, etc.), we will make arrangements to do so.     Although advances in technology are sophisticated, we cannot ensure that it will always work on either your end or our end.  If the connection with a video visit is poor, the visit may have to be switched to a telephone visit.  With either a video or telephone visit, we are not always able to ensure that we have a secure connection.     I need to obtain your verbal consent now.   Are you willing to proceed with your visit today?    Neeka Wann has provided verbal consent on 06/26/2021 for a virtual visit (video or telephone).   Piedad Climes, [place]   Date: 06/26/2021 12:02 PM   Virtual Visit via Video Note   I, Piedad Climes, connected with  Patty Wong  ([id number], 02/09/1966) on 06/26/21 at 12:00 PM EST by a video-enabled telemedicine application and verified that I am speaking with the correct person using two identifiers.  Location: Patient: Virtual Visit Location Patient: Home Provider: Virtual Visit Location Provider: Home Office   I discussed the limitations of evaluation and management by  telemedicine and the availability of in person appointments. The patient expressed understanding and agreed to proceed.    History of Present Illness: Patty Wong is a 56 y.o. who identifies as a female who was assigned female at birth, and is being seen today for possible clot in L leg. Notes swelling and pain from knee down. Posterior knee pain only. Notes history of DVT. Is currently off of her ASA x 1 month. Has had long plane rides recently coming back from [place] and has been very sedentary due to back issues. Denies fever, chills, chest pain or SOB.   HPI: HPI  Problems: There are no problems to display for this patient.   Allergies: No Known Allergies Medications:  Current Outpatient Medications:    aspirin EC 81 MG tablet, Take 1 tablet (81 mg total) by mouth daily. Swallow whole.  Last dose 05/01/2020, Disp: 30 tablet, Rfl: 11   docusate sodium (COLACE) 100 MG capsule, Take 200 mg by mouth daily., Disp: , Rfl:    enalapril-hydrochlorothiazide (VASERETIC) 10-25 MG tablet, Take 1 tablet by mouth daily., Disp: , Rfl:    esomeprazole (NEXIUM) 20 MG capsule, Take 20 mg by mouth daily at 12 noon., Disp: , Rfl:    gabapentin (NEURONTIN) 300 MG capsule, Take 300 mg by mouth every 8 (eight) hours., Disp: , Rfl:    Multiple Vitamins-Minerals (HAIR SKIN AND NAILS FORMULA  PO), Take 1 tablet by mouth daily as needed., Disp: , Rfl:    Multiple Vitamins-Minerals (MULTIVITAMIN WITH MINERALS) tablet, Take 1 tablet by mouth daily. Woman 50+, Disp: , Rfl:    polycarbophil (FIBERCON) 625 MG tablet, Take 625 mg by mouth daily., Disp: , Rfl:    sertraline (ZOLOFT) 50 MG tablet, Take 50 mg by mouth daily., Disp: , Rfl:   Observations/Objective: Patient is well-developed, well-nourished in no acute distress.  Resting comfortably at home.  Head is normocephalic, atraumatic.  No labored breathing. Speech is clear and coherent with logical content.  Patient is alert and oriented at baseline.  Unable to  visualize leg due to her back issues and camera positioning.   Assessment and Plan: 1. Leg swelling  With multiple risk factors for DVT -- history of DVT, prolonged immobilization (flights to and from [place] and been sedentary due to ongoing issues with her back), off of ASA, leg and calf swelling with pain. She has been triaged to [facility] ER for evaluation and management. Significant other to take her there as she is stable and no chest pain or SOB.  No charge due to ER disposition given.   Follow Up Instructions: I discussed the assessment and treatment plan with the patient. The patient was provided an opportunity to ask questions and all were answered. The patient agreed with the plan and demonstrated an understanding of the instructions.  A copy of instructions were sent to the patient via MyChart unless otherwise noted below.   The patient was advised to call back or seek an in-person evaluation if the symptoms worsen or if the condition fails to improve as anticipated.  Time:  I spent 12 minutes with the patient via telehealth technology discussing the above problems/concerns.    Piedad Climes, PA-C

## 2021-06-26 NOTE — Patient Instructions (Signed)
°  [name], thank you for joining [name], PA-C for today's virtual visit.  While this provider is not your primary care provider (PCP), if your PCP is located in our provider database this encounter information will be shared with them immediately following your visit.  Consent: (Patient) Patty Wong provided verbal consent for this virtual visit at the beginning of the encounter.  Current Medications:  Current Outpatient Medications:    aspirin EC 81 MG tablet, Take 1 tablet (81 mg total) by mouth daily. Swallow whole.  Last dose 05/01/2020, Disp: 30 tablet, Rfl: 11   docusate sodium (COLACE) 100 MG capsule, Take 200 mg by mouth daily., Disp: , Rfl:    enalapril-hydrochlorothiazide (VASERETIC) 10-25 MG tablet, Take 1 tablet by mouth daily., Disp: , Rfl:    esomeprazole (NEXIUM) 20 MG capsule, Take 20 mg by mouth daily at 12 noon., Disp: , Rfl:    gabapentin (NEURONTIN) 300 MG capsule, Take 300 mg by mouth every 8 (eight) hours., Disp: , Rfl:    Multiple Vitamins-Minerals (HAIR SKIN AND NAILS FORMULA PO), Take 1 tablet by mouth daily as needed., Disp: , Rfl:    Multiple Vitamins-Minerals (MULTIVITAMIN WITH MINERALS) tablet, Take 1 tablet by mouth daily. Woman 50+, Disp: , Rfl:    polycarbophil (FIBERCON) 625 MG tablet, Take 625 mg by mouth daily., Disp: , Rfl:    sertraline (ZOLOFT) 50 MG tablet, Take 50 mg by mouth daily., Disp: , Rfl:    Medications ordered in this encounter:  No orders of the defined types were placed in this encounter.    *If you need refills on other medications prior to your next appointment, please contact your pharmacy*  Follow-Up: Call back or seek an in-person evaluation if the symptoms worsen or if the condition fails to improve as anticipated.  Other Instructions AS discussed, please be evaluated at [facility] ER ASAP for possible clot in the leg   If you have been instructed to have an in-person evaluation today at a local Urgent Care facility,  please use the link below. It will take you to a list of all of our available [facility], including address, phone number and hours of operation. Please do not delay care.  [facility]  If you or a family member do not have a primary care provider, use the link below to schedule a visit and establish care. When you choose a [facility] primary care physician or advanced practice provider, you gain a long-term partner in health. Find a Primary Care Provider  Learn more about 's in-office and virtual care options: [facility]

## 2021-06-27 DIAGNOSIS — Z7982 Long term (current) use of aspirin: Secondary | ICD-10-CM | POA: Diagnosis not present

## 2021-06-27 DIAGNOSIS — I8002 Phlebitis and thrombophlebitis of superficial vessels of left lower extremity: Secondary | ICD-10-CM | POA: Insufficient documentation

## 2021-06-27 DIAGNOSIS — M79605 Pain in left leg: Secondary | ICD-10-CM | POA: Diagnosis present

## 2021-06-27 NOTE — ED Provider Notes (Signed)
[facility] Provider Note    Event Date/Time   First MD Initiated Contact with Patient 06/27/21 1630     (approximate)  History   Chief Complaint: Leg Pain  HPI  Patty Wong is a 56 y.o. female with a past medical history of anxiety, presents to the emergency department for left leg pain.  According to the patient she has been experiencing back trouble recently which she has been less mobile than usual.  Patient had some mild redness to the left thigh with concern for possible blood clots which came to the emergency department for evaluation.  Patient states a history of prior DVT however states she only took aspirin for a week and antibiotics, this sounds like it was more likely a thrombophlebitis.  Patient normally takes 81 mg aspirin but states she has not taken it for the past month or 2.  Physical Exam   Triage Vital Signs: ED Triage Vitals  Enc Vitals Group     BP 06/27/21 1314 (!) 114/94     Pulse Rate 06/27/21 1314 90     Resp 06/27/21 1314 17     Temp 06/27/21 1314 98.5 F (36.9 C)     Temp Source 06/27/21 1314 Oral     SpO2 06/27/21 1314 100 %     Weight 06/27/21 1315 260 lb (117.9 kg)     Height 06/27/21 1315 5\' 5"  (1.651 m)     Head Circumference --      Peak Flow --      Pain Score 06/27/21 1315 4     Pain Loc --      Pain Edu? --      Excl. in GC? --     Most recent vital signs: Vitals:   06/27/21 1314 06/27/21 1648  BP: (!) 114/94 (!) 127/91  Pulse: 90 75  Resp: 17 16  Temp: 98.5 F (36.9 C) 98.4 F (36.9 C)  SpO2: 100% 99%    General: Awake, no distress.  CV:  Good peripheral perfusion.  Regular rate and rhythm  Resp:  Normal effort.  Equal breath sounds bilaterally.  Abd:  No distention.  Soft, nontender.  No rebound or guarding. Other:  Mild erythema and tenderness to the medial thigh/knee area.  Neuro vastly intact distally.   ED Results / Procedures / Treatments   RADIOLOGY  Ultrasound negative for DVT.   Superficial thrombophlebitis of the mid left greater saphenous   MEDICATIONS ORDERED IN ED: Medications - No data to display   IMPRESSION / MDM / ASSESSMENT AND PLAN / ED COURSE  I reviewed the triage vital signs and the nursing notes.  Patient presents emergency department for left leg discomfort redness and tenderness near her knee into the medial thigh.  Patient states a history of a prior clot which sounds like a thrombophlebitis based on her description.  Patient's ultrasound today is negative for DVT but is positive for superficial thrombophlebitis of the left mid great saphenous.  I spoke to the patient regarding anticoagulation versus aspirin and warm compresses.  Patient would prefer aspirin warm compresses and will follow-up with her doctor for repeat ultrasound in 3 to 4 weeks.  Discussed return precautions for any chest pain or shortness of breath.  Patient agreeable.  FINAL CLINICAL IMPRESSION(S) / ED DIAGNOSES   Superficial thrombophlebitis  Rx / DC Orders    Full-strength aspirin Warm compresses Follow-up ultrasound in 3 to 4 weeks  Note:  This document was prepared using Dragon  voice recognition software and may include unintentional dictation errors.   Minna Antis, MD 06/27/21 [phone]

## 2021-06-27 NOTE — ED Triage Notes (Signed)
Pt in via POV, reports new onset pain and discoloration to left posterior leg since Monday.  PCP concerned for DVT due to history of same; orders for outpatient [place] placed but unable to get scheduled in a timely manner.    Vitals WDL, NAD noted at this time.

## 2021-06-27 NOTE — ED Notes (Signed)
EDP at BS 

## 2021-06-27 NOTE — Telephone Encounter (Signed)
Pt was seen 06/26/21 via tele health provider, and was advised to see [facility]  ER for possible DVT. The pt called and said that she had her neurology office fax over an order for her to have an LLE U/S. I made the pt aware that per our offices U/S tech manager the soonest we could see her for this U/S was Tuesday so we would recommend she follow the Tele-health providers order and go to the ER due to the possibility of a DVT.

## 2021-06-27 NOTE — Discharge Instructions (Signed)
Please take a full-strength enteric-coated aspirin each day for the next 1 month, then you may switch to 81 mg enteric-coated aspirin.  Please follow-up with your doctor for repeat ultrasound in the next 3 to 4 weeks.  If you have worsening leg pain redness swelling any chest pain or shortness of breath please return immediately to the emergency department for evaluation.

## 2021-06-27 NOTE — ED Notes (Addendum)
Here for L posterior knee pressure, swelling and redness. From venous doppler US to exam room. Results present in chart, negative for DVT, see other note re: superficial thrombophlebitis. Pt mentions chronic S1 sciatica with L lateral thigh pain.

## 2021-09-23 NOTE — Progress Notes (Signed)
Surgical Instructions ? ? ? Your procedure is scheduled on Tuesday, April 25th, 2023. ? ? Report to [facility] Main Entrance "A" at 11:20 A.M., then check in with the Admitting office. ? Call this number if you have problems the morning of surgery: ? [phone] ? ? If you have any questions prior to your surgery date call [phone]: Open Monday-Friday 8am-4pm ? ? ? Remember: ? Do not eat after midnight the night before your surgery ? ?You may drink clear liquids until 10:20 the morning of your surgery.   ?Clear liquids allowed are: Water, Non-Citrus Juices (without pulp), Carbonated Beverages, Clear Tea, Black Coffee ONLY (NO MILK, CREAM OR POWDERED CREAMER of any kind), and Gatorade ?  ? Take these medicines the morning of surgery with A SIP OF WATER:  ? ?gabapentin (NEURONTIN)  ?lansoprazole (PREVACID) ?sertraline (ZOLOFT)  ?traMADol Janean Sark) - as needed ? ?Follow your surgeon's instructions on when to stop Aspirin.  If no instructions were given by your surgeon then you will need to call the office to get those instructions.    ? ?As of today, STOP taking any Aspirin (unless otherwise instructed by your surgeon) Aleve, Naproxen, Ibuprofen, Motrin, Advil, Goody's, BC's, all herbal medications, fish oil, and all vitamins. ? ? ? The day of surgery: ?         ?Do not wear jewelry or makeup ?Do not wear lotions, powders, perfumes, or deodorant. ?Do not shave 48 hours prior to surgery.   ?Do not bring valuables to the hospital. ?Do not wear nail polish, gel polish, artificial nails, or any other type of covering on natural nails (fingers and toes) ?If you have artificial nails or gel coating that need to be removed by a nail salon, please have this removed prior to surgery. Artificial nails or gel coating may interfere with anesthesia's ability to adequately monitor your vital signs. ? ? ?[facility] is not responsible for any belongings or valuables. .  ? ?Do NOT Smoke (Tobacco/Vaping)  24 hours prior to your  procedure ? ?If you use a CPAP at night, you may bring your mask for your overnight stay. ?  ?Contacts, glasses, hearing aids, dentures or partials may not be worn into surgery, please bring cases for these belongings ?  ?For patients admitted to the hospital, discharge time will be determined by your treatment team. ?  ?Patients discharged the day of surgery will not be allowed to drive home, and someone needs to stay with them for 24 hours. ? ? ?SURGICAL WAITING ROOM VISITATION ?Patients having surgery or a procedure in a hospital may have two support people. ?Children under the age of 3 must have an adult with them who is not the patient. ?They may stay in the waiting area during the procedure and may switch out with other visitors. If the patient needs to stay at the hospital during part of their recovery, the visitor guidelines for inpatient rooms apply. ? ?Please refer to the [facility] website for the visitor guidelines for Inpatients (after your surgery is over and you are in a regular room).  ? ? ? ?Special instructions:   ? ?Oral Hygiene is also important to reduce your risk of infection.  Remember - BRUSH YOUR TEETH THE MORNING OF SURGERY WITH YOUR REGULAR TOOTHPASTE ? ? ?[facility]- Preparing For Surgery ? ?Before surgery, you can play an important role. Because skin is not sterile, your skin needs to be as free of germs as possible. You can reduce the number  of germs on your skin by washing with CHG (chlorahexidine gluconate) Soap before surgery.  CHG is an antiseptic cleaner which kills germs and bonds with the skin to continue killing germs even after washing.   ? ? ?Please do not use if you have an allergy to CHG or antibacterial soaps. If your skin becomes reddened/irritated stop using the CHG.  ?Do not shave (including legs and underarms) for at least 48 hours prior to first CHG shower. It is OK to shave your face. ? ?Please follow these instructions carefully. ?  ? ? Shower the NIGHT BEFORE  SURGERY and the MORNING OF SURGERY with CHG Soap.  ? If you chose to wash your hair, wash your hair first as usual with your normal shampoo. After you shampoo, rinse your hair and body thoroughly to remove the shampoo.  Then [employer] and genitals (private parts) with your normal soap and rinse thoroughly to remove soap. ? ?After that Use CHG Soap as you would any other liquid soap. You can apply CHG directly to the skin and wash gently with a scrungie or a clean washcloth.  ? ?Apply the CHG Soap to your body ONLY FROM THE NECK DOWN.  Do not use on open wounds or open sores. Avoid contact with your eyes, ears, mouth and genitals (private parts). Wash Face and genitals (private parts)  with your normal soap.  ? ?Wash thoroughly, paying special attention to the area where your surgery will be performed. ? ?Thoroughly rinse your body with warm water from the neck down. ? ?DO NOT shower/wash with your normal soap after using and rinsing off the CHG Soap. ? ?Pat yourself dry with a CLEAN TOWEL. ? ?Wear CLEAN PAJAMAS to bed the night before surgery ? ?Place CLEAN SHEETS on your bed the night before your surgery ? ?DO NOT SLEEP WITH PETS. ? ? ?Day of Surgery: ? ?Take a shower with CHG soap. ?Wear Clean/Comfortable clothing the morning of surgery ?Do not apply any deodorants/lotions.   ?Remember to brush your teeth WITH YOUR REGULAR TOOTHPASTE. ? ? ? ?If you received a COVID test during your pre-op visit  it is requested that you wear a mask when out in public, stay away from anyone that may not be feeling well and notify your surgeon if you develop symptoms. If you have been in contact with anyone that has tested positive in the last 10 days please notify you surgeon. ? ?  ?Please read over the following fact sheets that you were given.   ?

## 2021-09-24 DIAGNOSIS — Z01818 Encounter for other preprocedural examination: Secondary | ICD-10-CM | POA: Diagnosis present

## 2021-09-24 DIAGNOSIS — I10 Essential (primary) hypertension: Secondary | ICD-10-CM | POA: Insufficient documentation

## 2021-09-30 DIAGNOSIS — I10 Essential (primary) hypertension: Secondary | ICD-10-CM

## 2021-09-30 DIAGNOSIS — K219 Gastro-esophageal reflux disease without esophagitis: Secondary | ICD-10-CM | POA: Diagnosis present

## 2021-09-30 DIAGNOSIS — Z86718 Personal history of other venous thrombosis and embolism: Secondary | ICD-10-CM | POA: Diagnosis not present

## 2021-09-30 DIAGNOSIS — M48061 Spinal stenosis, lumbar region without neurogenic claudication: Principal | ICD-10-CM | POA: Diagnosis present

## 2021-09-30 DIAGNOSIS — Z6841 Body Mass Index (BMI) 40.0 and over, adult: Secondary | ICD-10-CM

## 2021-09-30 DIAGNOSIS — G8929 Other chronic pain: Secondary | ICD-10-CM | POA: Diagnosis present

## 2021-09-30 DIAGNOSIS — M4316 Spondylolisthesis, lumbar region: Secondary | ICD-10-CM

## 2021-09-30 DIAGNOSIS — Z01818 Encounter for other preprocedural examination: Secondary | ICD-10-CM

## 2021-09-30 DIAGNOSIS — Z79899 Other long term (current) drug therapy: Secondary | ICD-10-CM | POA: Diagnosis not present

## 2021-09-30 DIAGNOSIS — Z7982 Long term (current) use of aspirin: Secondary | ICD-10-CM

## 2021-09-30 DIAGNOSIS — M4726 Other spondylosis with radiculopathy, lumbar region: Secondary | ICD-10-CM

## 2021-09-30 HISTORY — PX: TRANSFORAMINAL LUMBAR INTERBODY FUSION W/ MIS 1 LEVEL: SHX6145

## 2021-09-30 MED ORDER — ACETAMINOPHEN 650 MG RE SUPP
650.0000 mg | RECTAL | Status: DC | PRN
Start: 2021-09-30 — End: 2021-10-01

## 2021-09-30 MED ORDER — DEXAMETHASONE SODIUM PHOSPHATE 10 MG/ML IJ SOLN
INTRAMUSCULAR | Status: DC | PRN
Start: 2021-09-30 — End: 2021-09-30

## 2021-09-30 MED ORDER — ROCURONIUM BROMIDE 10 MG/ML (PF) SYRINGE
PREFILLED_SYRINGE | INTRAVENOUS | Status: DC | PRN
Start: 2021-09-30 — End: 2021-09-30

## 2021-09-30 MED ORDER — EPHEDRINE SULFATE-NACL 50-0.9 MG/10ML-% IV SOSY
PREFILLED_SYRINGE | INTRAVENOUS | Status: DC | PRN
Start: 2021-09-30 — End: 2021-09-30

## 2021-09-30 NOTE — H&P (Signed)
? ? ?Providing Compassionate, Quality Care - Together ? ?NEUROSURGERY HISTORY & PHYSICAL ? ? ?Patty Wong is an 56 y.o. female.   ?Chief Complaint: Left lower extremity radiculopathy ?HPI: This is a 56 year old female with a history of an L5-S1 PLIF, herniated disc at L3-4 on the left with radiculopathy, status post microdiscectomy, with worsening low back pain and progressive worsening lower extremity radiculopathy.  She failed multiple conservative measures including epidural steroid injections.  Imaging revealed severe stenosis due to facet hypertrophy at L4-5, with left greater than right lateral recess stenosis, as well as moderate to severe stenosis at L2-3, left severe lateral recess stenosis.  She presents today for surgical intervention. ? ?Past Medical History:  ?Diagnosis Date  ? Anemia   ? while pregnant  ? Anxiety   ? Arthritis   ? back  ? DVT (deep venous thrombosis) ([place]) 2014  ? calf- unknown which leg  ? GERD (gastroesophageal reflux disease)   ? History of hiatal hernia   ? Hypertension   ? Peripheral vascular disease ([place])   ? PONV (postoperative nausea and vomiting)   ? ? ?Past Surgical History:  ?Procedure Laterality Date  ? BACK SURGERY    ? 2007, 1994  ? [place]  ? LUMBAR LAMINECTOMY/ DECOMPRESSION WITH MET-RX N/A 05/10/2020  ? Procedure: MICRODISCECTOMY LEFT LUMBAR THREE- LUMBAR FOUR;  Surgeon: Karsten Ro, DO;  Location: [facility];  Service: Neurosurgery;  Laterality: N/A;  posterior  ? TONSILLECTOMY    ? removed as a child  ? TUBAL LIGATION    ? ? ?History reviewed. No pertinent family history. ?Social History:  reports that she has never smoked. She has never used smokeless tobacco. She reports that she does not currently use alcohol. She reports that she does not currently use drugs. ? ?Allergies: No Known Allergies ? ?Medications Prior to Admission  ?Medication Sig Dispense Refill  ? aspirin 81 MG EC tablet Take 81 mg by mouth daily.    ? docusate sodium (COLACE)  100 MG capsule Take 200 mg by mouth in the morning.    ? enalapril-hydrochlorothiazide (VASERETIC) 10-25 MG tablet Take 1 tablet by mouth daily.    ? gabapentin (NEURONTIN) 300 MG capsule Take 600 mg by mouth 3 (three) times daily.    ? lansoprazole (PREVACID) 30 MG capsule Take 30 mg by mouth daily.    ? Multiple Vitamins-Minerals (HAIR SKIN AND NAILS FORMULA PO) Take 1 tablet by mouth daily.    ? Multiple Vitamins-Minerals (MULTIVITAMIN WITH MINERALS) tablet Take 1 tablet by mouth daily. Woman 50+    ? polyethylene glycol (MIRALAX / GLYCOLAX) 17 g packet Take 17 g by mouth in the morning.    ? Probiotic Product (PROBIOTIC DAILY PO) Take 1 capsule by mouth daily.    ? sertraline (ZOLOFT) 50 MG tablet Take 50 mg by mouth daily.    ? traMADol (ULTRAM) 50 MG tablet Take 50 mg by mouth every 4 (four) hours as needed for pain.    ? WEGOVY 0.25 MG/0.5ML SOAJ Inject 0.25 mg into the skin every Wednesday.    ? calcium carbonate (TUMS CHEWY BITES) 750 MG chewable tablet Chew 1 tablet by mouth at bedtime as needed for heartburn.    ? ? ?No results found for this or any previous visit (from the past 48 hour(s)). ?No results found. ? ?ROS ?All pertinent positives and negatives are listed in HPI above ? ?Blood pressure (!) 105/92, pulse 78, temperature 97.8 ?  F (36.6 ?C), temperature source Oral, resp. rate 18, height _0  (1.676 m), weight 117 kg, SpO2 99 %. ?Physical Exam  ?Awake alert oriented x3, no acute distress ?PERRLA ?EOMI ?Cranial nerves II through XII intact ?Speech fluent and appropriate ?Bilateral upper extremity full strength throughout ?Right lower extremity full strength throughout ?Left lower extremity HF/KE 4/5, otherwise 4+ to 5/5 throughout ? ? ?Assessment/Plan ?56 year old female with ? ?L4-5 adjacent segment disease with severe stenosis and left lower extremity radiculopathy ?L2-3 stenosis with radiculopathy ? ?-OR today for L4-5 decompression and transforaminal lumbar interbody fusion, exploration of  fusion and revision of hardware at L5-S1, L2-3 minimally invasive decompression.  We discussed all risks, benefits and expected outcomes as well as alternatives to treatment.  Informed consent was obtained. ? ?Thank you for allowing me to participate in this patient's care.  Please do not hesitate to call with questions or concerns. ? ? ?Dmonte Maher, DO ?Neurosurgeon ?[facility] ?Cell: [phone] ? ? ?

## 2021-09-30 NOTE — Anesthesia Preprocedure Evaluation (Addendum)
Anesthesia Evaluation  ?Patient identified by MRN, date of birth, ID band ?Patient awake ? ? ? ?Reviewed: ?Allergy & Precautions, NPO status , Patient's Chart, lab work & pertinent test results ? ?History of Anesthesia Complications ?(+) PONV and history of anesthetic complications ? ?Airway ?Mallampati: II ? ?TM Distance: >3 FB ?Neck ROM: Full ? ? ? Dental ?no notable dental hx. ? ?  ?Pulmonary ?neg pulmonary ROS,  ?  ?Pulmonary exam normal ?breath sounds clear to auscultation ? ? ? ? ? ? Cardiovascular ?hypertension, Pt. on medications ?+ Peripheral Vascular Disease and + DVT  ?Normal cardiovascular exam ?Rhythm:Regular Rate:Normal ? ? ?  ?Neuro/Psych ?Anxiety negative neurological ROS ?   ? GI/Hepatic ?Neg liver ROS, hiatal hernia, GERD  ,  ?Endo/Other  ?Morbid obesity ? Renal/GU ?negative Renal ROS  ? ?  ?Musculoskeletal ? ?(+) Arthritis ,  ? Abdominal ?(+) + obese,   ?Peds ? Hematology ? ?(+) Blood dyscrasia, anemia ,   ?Anesthesia Other Findings ? ? Reproductive/Obstetrics ? ?  ? ? ? ? ? ? ? ? ? ? ? ? ? ?  ?  ? ? ? ? ? ? ?Anesthesia Physical ? ?Anesthesia Plan ? ?ASA: 3 ? ?Anesthesia Plan: General  ? ?Post-op Pain Management: Tylenol PO (pre-op)* and Gabapentin PO (pre-op)*  ? ?Induction: Intravenous ? ?PONV Risk Score and Plan: 4 or greater and Ondansetron, Dexamethasone, Treatment may vary due to age or medical condition, Midazolam and Propofol infusion ? ?Airway Management Planned: Oral ETT ? ?Additional Equipment: None ? ?Intra-op Plan:  ? ?Post-operative Plan: Extubation in OR ? ?Informed Consent: I have reviewed the patients History and Physical, chart, labs and discussed the procedure including the risks, benefits and alternatives for the proposed anesthesia with the patient or authorized representative who has indicated his/her understanding and acceptance.  ? ? ? ?Dental advisory given ? ?Plan Discussed with: CRNA ? ?Anesthesia Plan Comments:   ? ? ? ? ? ? ?Anesthesia  Quick Evaluation ? ?

## 2021-09-30 NOTE — Anesthesia Procedure Notes (Addendum)
Procedure Name: Intubation ?Date/Time: 09/30/2021 12:36 PM ?Performed by: Vonna Drafts, CRNA ?Pre-anesthesia Checklist: Patient identified, Emergency Drugs available, Suction available and Patient being monitored ?Patient Re-evaluated:Patient Re-evaluated prior to induction ?Oxygen Delivery Method: Circle system utilized ?Preoxygenation: Pre-oxygenation with 100% oxygen ?Induction Type: IV induction ?Ventilation: Mask ventilation without difficulty ?Laryngoscope Size: Mac and 4 ?Grade View: Grade I ?Tube type: Oral ?Tube size: 7.0 mm ?Number of attempts: 1 ?Airway Equipment and Method: Stylet, Oral airway and Bite block ?Placement Confirmation: ETT inserted through vocal cords under direct vision, positive ETCO2 and breath sounds checked- equal and bilateral ?Secured at: 23 cm ?Tube secured with: Tape ?Dental Injury: Teeth and Oropharynx as per pre-operative assessment  ? ? ? ? ?

## 2021-09-30 NOTE — Transfer of Care (Signed)
Immediate Anesthesia Transfer of Care Note ? ?Patient: Patty Wong ? ?Procedure(s) Performed: LEFT LUMBAR FOUR-FIVE MINIMALLY INVASIVE (MIS) TRANSFORAMINAL LUMBAR INTERBODY FUSION (TLIF) WITH REVISION OF LUMBAR FIVE-SACRAL ONE FUSION AND LUMBAR TWO-THREE DECOMPRESSION (Spine Lumbar) ? ?Patient Location: PACU ? ?Anesthesia Type:General ? ?Level of Consciousness: drowsy ? ?Airway & Oxygen Therapy: Patient Spontanous Breathing ? ?Post-op Assessment: Report given to RN and Post -op Vital signs reviewed and stable ? ?Post vital signs: Reviewed and stable ? ?Last Vitals:  ?Vitals Value Taken Time  ?BP    ?Temp    ?Pulse 30 09/30/21 1806  ?Resp    ?SpO2 86 % 09/30/21 1806  ?Vitals shown include unvalidated device data. ? ?Last Pain:  ?Vitals:  ? 09/30/21 1153  ?TempSrc:   ?PainSc: 6   ?   ? ?  ? ?Complications: No notable events documented. ?

## 2021-09-30 NOTE — Anesthesia Procedure Notes (Signed)
Arterial Line Insertion ?Start/End4/25/2023 12:40 PM, 09/30/2021 12:45 PM ?Performed by: Nolon Nations, MD, Vonna Drafts, CRNA, CRNA ? Preanesthetic checklist: patient identified, IV checked, site marked, risks and benefits discussed, surgical consent, monitors and equipment checked, pre-op evaluation, timeout performed and anesthesia consent ?Left, radial was placed ?Catheter size: 20 G ? ?Attempts: 1 ?Procedure performed without using ultrasound guided technique. ?Following insertion, Biopatch and dressing applied. ?Post procedure assessment: normal ? ?Patient tolerated the procedure well with no immediate complications. ? ? ?

## 2021-10-01 NOTE — Discharge Summary (Signed)
Physician Discharge Summary  ?Patient ID: ?[name] ?MRN: [id number] ?DOB/AGE: [birth year] 56 y.o. ? ?Admit date: 09/30/2021 ?Discharge date: 10/01/2021 ? ?Admission Diagnoses: L4-5 severe stenosis, spondylosis, spondylolisthesis with left lower extremity radiculopathy, L2-3 lateral recess stenosis with left radiculopathy ? ?Discharge Diagnoses: L4-5 severe stenosis, spondylosis, spondylolisthesis with left lower extremity radiculopathy, L2-3 lateral recess stenosis with left radiculopathy ? ? ? ?Principal Problem: ?  Lumbar spinal stenosis ? ? ?Discharged Condition: good ? ?Hospital Course: The patient was admitted on 09/30/2021 and taken to the operating room where the patient underwent MIS decompression and TLIF - L4-L5, left-sided approach, with exploration of fusion, revision of hardware L5-S1, minimally invasive decompression left L2-3. The patient tolerated the procedure well and was taken to the recovery room and then to the floor in stable condition. The hospital course was routine. There were no complications. The wound remained clean dry and intact. Pt had appropriate back soreness. No complaints of leg pain or new N/T/W in her BLE. She has mild intermittent paresthesia in her right hand affecting the 1st, 2nd, and 3rd fingers. The paresthesia continued to improve during her admission. The patient remained afebrile with stable vital signs, and tolerated a regular diet. The patient continued to increase activities, and pain was well controlled with oral pain medications. ? ? ?Consults: None ? ?Significant Diagnostic Studies: radiology: X-Ray: intraoperative ? ? ?Treatments: surgery:  ?Minimally invasive L4-5 decompression and transforaminal lumbar interbody fusion and arthrodesis, left sided approach ?Bilateral L4 nonsegmental pedicle screw instrumentation, K2M 6.5x63mm bilaterally ?Exploration of fusion, L5-S1, with revision of hardware; K2 M 6.5 x 40 bilateral L5, 6.5 x 35 mm bilaterally S1 ?Placement of  anterior biomechanical device, K2M [place] 11 x 8.5 x 28 mm titanium interbody ?Minimally invasive L2-3 hemilaminotomy and left lateral recess decompression, for decompression of neural elements ?Intraoperative use of autograft, same incision ?Intraoperative use of allograft  ?Use of bmp, xxs ?Intraoperative use of fluoroscopy, greater than 1 hour ?Intraoperative use of microscope, for microdissection ? ?Discharge Exam: ?Blood pressure (!) 100/57, pulse 94, temperature 98.5 ?F (36.9 ?C), resp. rate 16, height 5\' 6"  (1.676 m), weight 117 kg, SpO2 97 %. ?Physical Exam: Patient is awake, A/O X 4, conversant, and in good spirits. Eyes open spontaneously. They are in NAD and VSS. Doing well. Speech is fluent and appropriate. MAEW with good strength that is symmetric bilaterally.  BUE 5/5 throughout, BLE 5/5 throughout. Sensation to light touch is intact. PERLA, EOMI. CNs grossly intact. Dressings are clean dry intact. Incisions are well approximated with no drainage, erythema, or edema. ? ? ? ? ? ?Disposition: Discharge disposition: 01-Home or Self Care ? ? ? ? ? ? ?Discharge Instructions   ? ? Incentive spirometry RT   Complete by: As directed ?  ? ?  ? ?Allergies as of 10/01/2021   ?No Known Allergies ?  ? ?  ?Medication List  ?  ? ?STOP taking these medications   ? ?traMADol 50 MG tablet ?Commonly known as: ULTRAM ?  ? ?  ? ?TAKE these medications   ? ?aspirin 81 MG EC tablet ?Take 81 mg by mouth daily. ?  ?docusate sodium 100 MG capsule ?Commonly known as: COLACE ?Take 200 mg by mouth in the morning. ?  ?enalapril-hydrochlorothiazide 10-25 MG tablet ?Commonly known as: VASERETIC ?Take 1 tablet by mouth daily. ?  ?gabapentin 300 MG capsule ?Commonly known as: NEURONTIN ?Take 600 mg by mouth 3 (three) times daily. ?  ?lansoprazole 30 MG capsule ?Commonly known as: PREVACID ?Take  30 mg by mouth daily. ?  ?methocarbamol 500 MG tablet ?Commonly known as: Robaxin ?Take 1 tablet (500 mg total) by mouth every 6 (six) hours  as needed for muscle spasms. ?  ?multivitamin with minerals tablet ?Take 1 tablet by mouth daily. Woman 50+ ?  ?HAIR SKIN AND NAILS FORMULA PO ?Take 1 tablet by mouth daily. ?  ?oxyCODONE-acetaminophen 5-325 MG tablet ?Commonly known as: Percocet ?Take 1-2 tablets by mouth every 6 (six) hours as needed for severe pain. ?  ?polyethylene glycol 17 g packet ?Commonly known as: MIRALAX / GLYCOLAX ?Take 17 g by mouth in the morning. ?  ?PROBIOTIC DAILY PO ?Take 1 capsule by mouth daily. ?  ?sertraline 50 MG tablet ?Commonly known as: ZOLOFT ?Take 50 mg by mouth daily. ?  ?Tums Chewy Bites 750 MG chewable tablet ?Generic drug: calcium carbonate ?Chew 1 tablet by mouth at bedtime as needed for heartburn. ?  ?Wegovy 0.25 MG/0.5ML Soaj ?Generic drug: Semaglutide-Weight Management ?Inject 0.25 mg into the skin every Wednesday. ?  ? ?  ? ? ? ?Signed: ?Marvis Moeller, DNP, NP-C ?10/01/2021, 8:35 AM ? ? ?

## 2021-10-01 NOTE — Evaluation (Signed)
Occupational Therapy Evaluation ?Patient Details ?Name: Patty Wong ?MRN: [id number] ?DOB: 02/13/1966 ?Today's Date: 10/01/2021 ? ? ?History of Present Illness Patty Wong is a 56 yo female who underwent TLIF - L4-L5 on 4/25. PMHx: prior back surgeries, anxiety, HTN, PVD, arthritis  ? ?Clinical Impression ?  ?Patty Wong was evaluated s/p the above back surgery, she is generally indep at baseline but has been limited by pain the past few months. She lives in a 1 level home, 1 STE and her significant other who works form home and is able to assist as needed. After review of back precautions and compensatory techniques pt demonstrated min G functional mobility with RW and min A for LB dressing. She has AE at home that will increase her indep with ADLs. Pt ambulated further than household distance and completed stair negotiation with min G and great safety awareness. Pt does not require further OT acutely. Recommend d/c to home with support of family.  ?   ? ?Recommendations for follow up therapy are one component of a multi-disciplinary discharge planning process, led by the attending physician.  Recommendations may be updated based on patient status, additional functional criteria and insurance authorization.  ? ?Follow Up Recommendations ? No OT follow up  ?  ?Assistance Recommended at Discharge Intermittent Supervision/Assistance  ?Patient can return home with the following A little help with walking and/or transfers;A little help with bathing/dressing/bathroom;Assistance with cooking/housework;Assist for transportation;Help with stairs or ramp for entrance ? ?  ?Functional Status Assessment ? Patient has had a recent decline in their functional status and demonstrates the ability to make significant improvements in function in a reasonable and predictable amount of time.  ?Equipment Recommendations ? BSC/3in1;Other (comment) (RW)  ?  ?   ?Precautions / Restrictions Precautions ?Precautions: Back;Fall ?Precaution Booklet Issued: Yes  (comment) ?Required Braces or Orthoses:  (no brace needed) ?Restrictions ?Weight Bearing Restrictions: No  ? ?  ? ?Mobility Bed Mobility ?Overal bed mobility: Needs Assistance ?Bed Mobility: Rolling, Sidelying to Sit ?Rolling: Supervision ?Sidelying to sit: Supervision ?  ?  ?  ?General bed mobility comments: increased time and use of hand rail - pt sleeps in recliner at baseline ?  ? ?Transfers ?Overall transfer level: Needs assistance ?Equipment used: Rolling walker (2 wheels) ?Transfers: Sit to/from Stand ?Sit to Stand: Min guard ?  ?  ?  ?  ?  ?General transfer comment: cues for hand placement ?  ? ?  ?Balance Overall balance assessment: Needs assistance ?Sitting-balance support: Feet supported ?Sitting balance-Leahy Scale: Good ?  ?  ?Standing balance support: No upper extremity supported, During functional activity ?Standing balance-Leahy Scale: Fair ?Standing balance comment: standing at the sink to groom without UE support - needs BUE for ambulation ?  ?  ?  ?  ?  ?  ?  ?  ?  ?  ?  ?   ? ?ADL either performed or assessed with clinical judgement  ? ?ADL Overall ADL's : Needs assistance/impaired ?Eating/Feeding: Independent;Sitting ?  ?Grooming: Min guard;Standing ?Grooming Details (indicate cue type and reason): cues for compensatory tehcniques ?Upper Body Bathing: Supervision/ safety;Sitting ?  ?Lower Body Bathing: Minimal assistance;Sit to/from stand;Adhering to back precautions ?  ?Upper Body Dressing : Set up;Sitting ?  ?Lower Body Dressing: Minimal assistance;Sit to/from stand ?Lower Body Dressing Details (indicate cue type and reason): would benefit from AE use - has reacher at home ?Toilet Transfer: Min guard;Ambulation;Rolling walker (2 wheels) ?  ?Toileting- Clothing Manipulation and Hygiene: Supervision/safety;Sitting/lateral lean ?Toileting - Clothing Manipulation Details (  indicate cue type and reason): has toilet aide at home ?  ?  ?Functional mobility during ADLs: Min guard;Rolling walker (2  wheels) ?General ADL Comments: benefitted from cues for compensatory techniques to maintain back precautions. pt demosntrates great carry over and insight  ? ? ? ?Vision Baseline Vision/History: 1 Wears glasses ?Ability to See in Adequate Light: 0 Adequate ?Vision Assessment?: No apparent visual deficits  ?   ?   ?   ? ?Pertinent Vitals/Pain Pain Assessment ?Pain Assessment: Faces ?Faces Pain Scale: Hurts little more ?Pain Location: back, R hip ?Pain Descriptors / Indicators: Discomfort, Grimacing, Guarding ?Pain Intervention(s): Monitored during session, Limited activity within patient's tolerance  ? ? ? ?Hand Dominance Right ?  ?Extremity/Trunk Assessment Upper Extremity Assessment ?Upper Extremity Assessment: Overall WFL for tasks assessed (some R shoulder pain reported) ?  ?Lower Extremity Assessment ?Lower Extremity Assessment: Defer to PT evaluation ?  ?Cervical / Trunk Assessment ?Cervical / Trunk Assessment: Back Surgery ?  ?Communication Communication ?Communication: No difficulties ?  ?Cognition Arousal/Alertness: Awake/alert ?Behavior During Therapy: [facility] for tasks assessed/performed ?Overall Cognitive Status: Within Functional Limits for tasks assessed ?  ?  ?  ?  ?  ?  ?  ?  ?  ?  ?General Comments: good recall of back precautions and compensatory techniques ?  ?  ?General Comments  VSS on RA, so Onalee Hua present ? ?  ? ?Home Living Family/patient expects to be discharged to:: Private residence ?Living Arrangements: Spouse/significant other ?Available Help at Discharge: Family;Available 24 hours/day (significnat other) ?Type of Home: House ?Home Access: Stairs to enter ?Entrance Stairs-Number of Steps: 1 ?  ?Home Layout: One level ?  ?  ?Bathroom Shower/Tub: Tub/shower unit;Walk-in shower ?  ?Bathroom Toilet: Standard ?  ?  ?Home Equipment: Cane - single point;Hand held shower head ?  ?  ?  ? ?  ?Prior Functioning/Environment Prior Level of Function : Independent/Modified Independent;Driving ?  ?  ?  ?  ?   ?  ?Mobility Comments: indep ?ADLs Comments: normally indep, needs assist wtih socks sometimes ?  ? ?  ?  ?OT Problem List: Decreased range of motion;Decreased strength;Impaired balance (sitting and/or standing);Decreased activity tolerance;Decreased knowledge of precautions;Decreased knowledge of use of DME or AE;Pain ?  ?   ?   ?OT Goals(Current goals can be found in the care plan section) Acute Rehab OT Goals ?Patient Stated Goal: home ?OT Goal Formulation: With patient ?Time For Goal Achievement: 10/01/21 ?Potential to Achieve Goals: Good  ?OT Frequency:   ?  ? ?   ?AM-PAC OT "6 Clicks" Daily Activity     ?Outcome Measure Help from another person eating meals?: None ?Help from another person taking care of personal grooming?: A Little ?Help from another person toileting, which includes using toliet, bedpan, or urinal?: A Little ?Help from another person bathing (including washing, rinsing, drying)?: A Little ?Help from another person to put on and taking off regular upper body clothing?: None ?Help from another person to put on and taking off regular lower body clothing?: A Little ?6 Click Score: 20 ?  ?End of Session Equipment Utilized During Treatment: Rolling walker (2 wheels) ?Nurse Communication: Mobility status ? ?Activity Tolerance: Patient tolerated treatment well ?Patient left: in chair;with call bell/phone within reach;with nursing/sitter in room ? ?OT Visit Diagnosis: Unsteadiness on feet (R26.81);Pain  ?              ?Time: 6314-9702 ?OT Time Calculation (min): 55 min ?Charges:  OT General Charges ?$OT Visit:  1 Visit ?OT Evaluation ?$OT Eval Moderate Complexity: 1 Mod ?OT Treatments ?$Self Care/Home Management : 8-22 mins ?$Therapeutic Activity: 23-37 mins ? ? ?Laketia Vicknair A Leverett Camplin ?10/01/2021, 10:54 AM ?

## 2021-10-01 NOTE — Progress Notes (Signed)
PT Cancellation Note ? ?Patient Details ?Name: Patty Wong ?MRN: [id number] ?DOB: 01/21/1966 ? ? ?Cancelled Treatment:    Reason Eval/Treat Not Completed: PT screened, no needs identified, will sign off Per OT, patient is modI with use of RW. All education completed by OT. No skilled PT needs identified. PT will sign off.  ? ?Tanisia Yokley A. Dan Humphreys, PT, DPT ?Acute Rehabilitation Services ?Pager [phone] ?Office [phone] ? ? ? ?Neill Jurewicz A Blaire Palomino ?10/01/2021, 9:57 AM ?

## 2021-10-01 NOTE — Plan of Care (Signed)
Pt doing well. Pt and husband given D/C instructions with verbal understanding. Rx's were sent to the pharmacy by MD. Pt's incision is clean and dry with no sign of infection. Pt's IV was removed prior to D/C. Pt received RW and 3-n-1 per MD order. Pt D/C'd home via wheelchair per MD order. Pt is stable @ D/C and has no other needs at this time. Rema Fendt, RN  ?

## 2021-10-02 NOTE — Anesthesia Postprocedure Evaluation (Signed)
Anesthesia Post Note ? ?Patient: Patty Wong ? ?Procedure(s) Performed: LEFT LUMBAR FOUR-FIVE MINIMALLY INVASIVE (MIS) TRANSFORAMINAL LUMBAR INTERBODY FUSION (TLIF) WITH REVISION OF LUMBAR FIVE-SACRAL ONE FUSION AND LUMBAR TWO-THREE DECOMPRESSION (Spine Lumbar) ? ?  ? ?Patient location during evaluation: PACU ?Anesthesia Type: General ?Level of consciousness: sedated and patient cooperative ?Pain management: pain level controlled ?Vital Signs Assessment: post-procedure vital signs reviewed and stable ?Respiratory status: spontaneous breathing ?Cardiovascular status: stable ?Anesthetic complications: no ? ? ?No notable events documented. ? ?Last Vitals:  ?Vitals:  ? 10/01/21 0517 10/01/21 0942  ?BP: (!) 100/57 113/70  ?Pulse: 94 95  ?Resp:  18  ?Temp: 36.9 ?C   ?SpO2: 97% 100%  ?  ?Last Pain:  ?Vitals:  ? 10/01/21 0920  ?TempSrc:   ?PainSc: 7   ? ? ?  ?  ?  ?  ?  ?  ? ?Lewie Loron ? ? ? ? ?

## 2022-10-30 IMAGING — US US EXTREM LOW VENOUS*L*
1 series · 13 of 24 positions shown · non-contrast
Comparison: 02/21/2013 contralateral

CLINICAL DATA: Pain and discoloration x4 days, history of DVT and
varicose veins

EXAM:
LEFT LOWER EXTREMITY VENOUS DOPPLER ULTRASOUND
TECHNIQUE: Gray-scale sonography with compression, as well as color and duplex
ultrasound, were performed to evaluate the deep venous system(s)
from the level of the common femoral vein through the popliteal and
proximal calf veins.

[Series 1: us venous img lower uni left (dvt) · portal-venous · 13 of 40 slices shown]
[im 1/40]
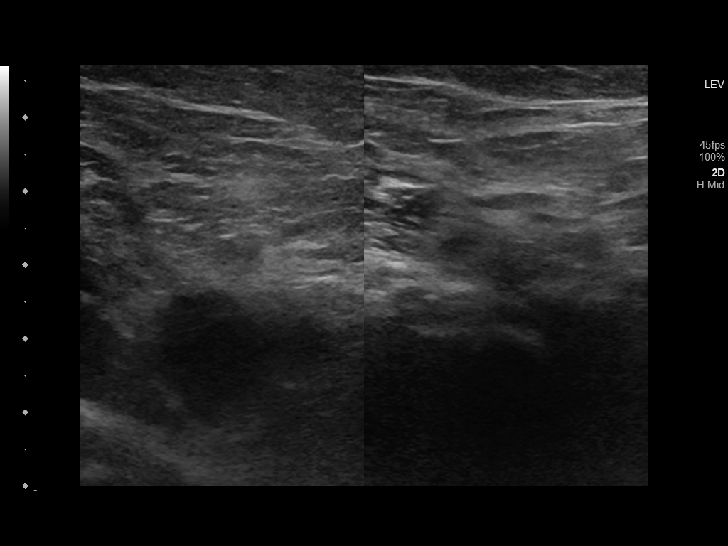
[im 4/40]
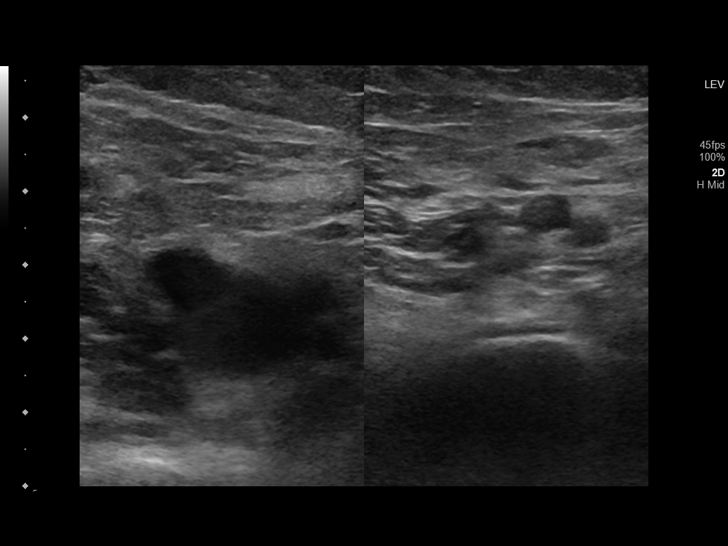
[im 7/40]
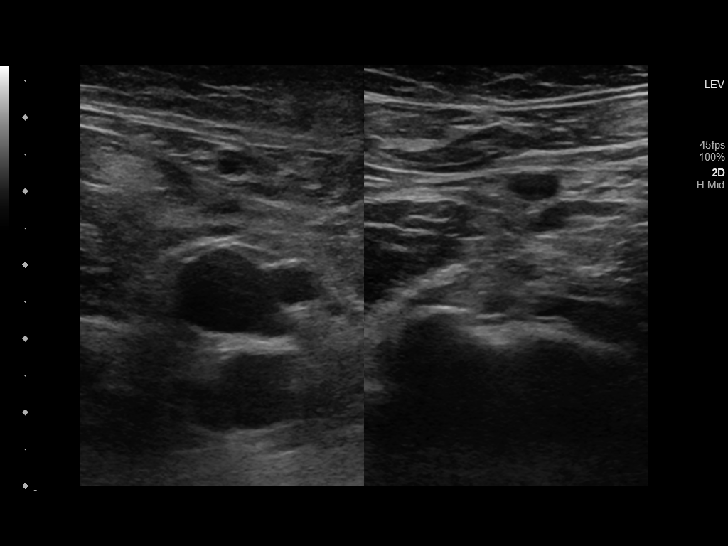
[im 11/40]
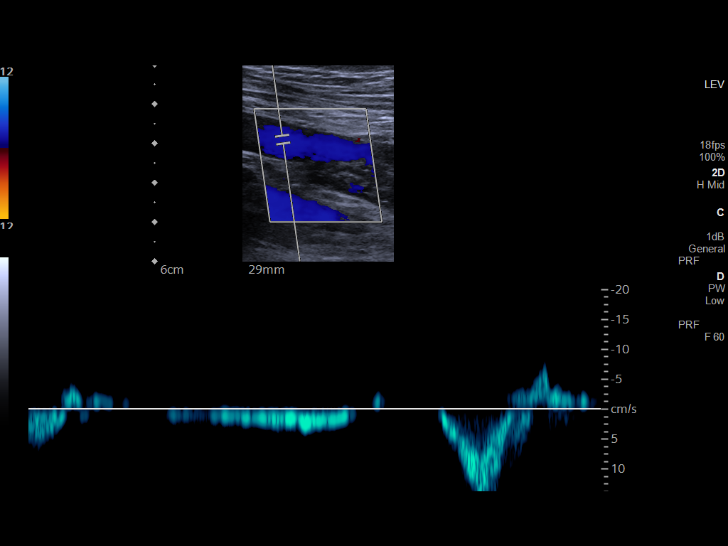
[im 14/40]
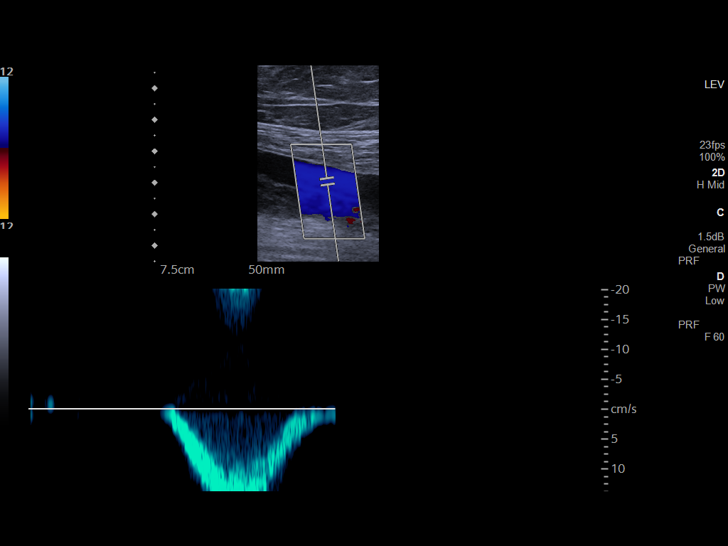
[im 17/40]
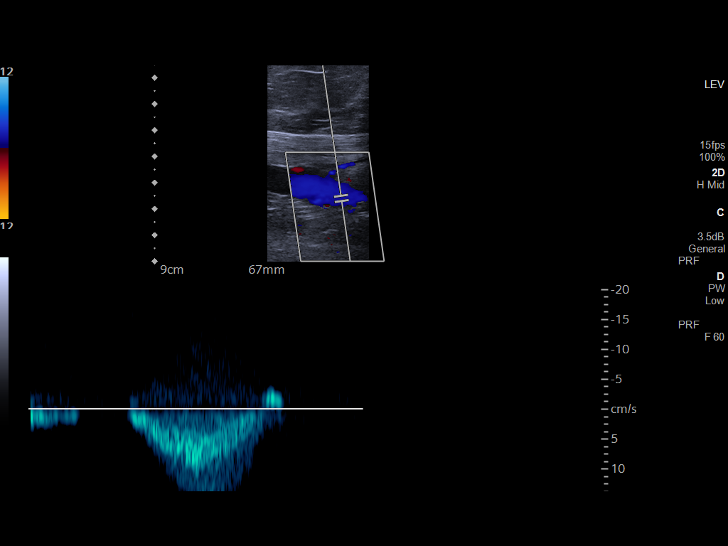
[im 21/40]
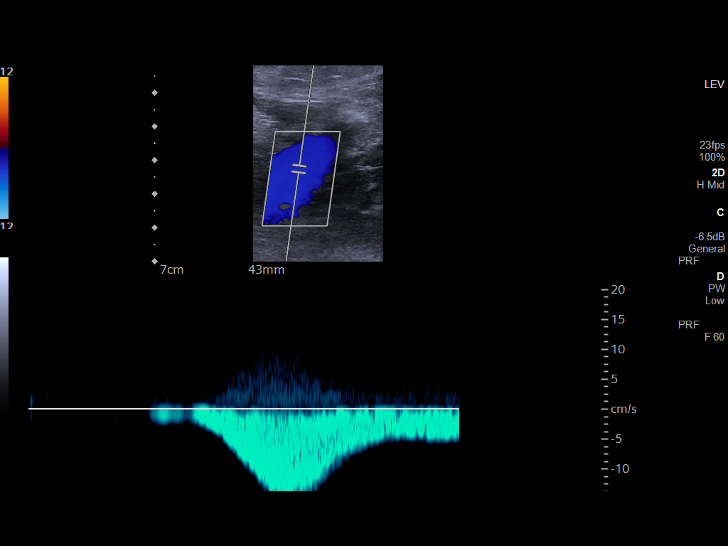
[im 23/40]
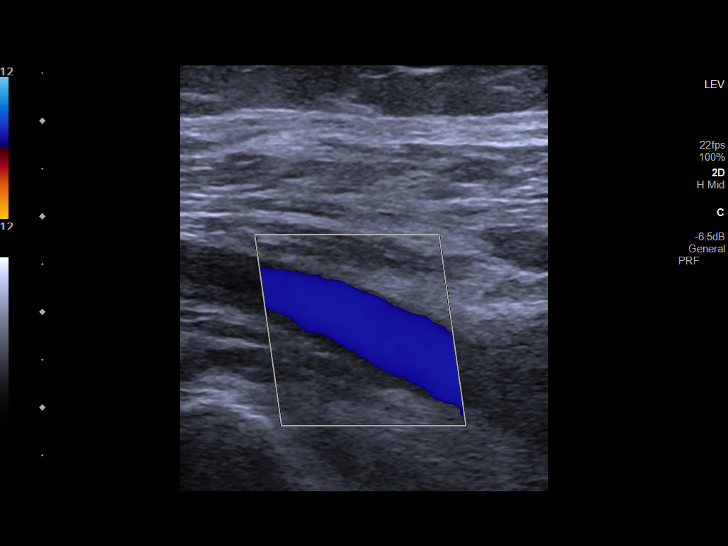
[im 26/40]
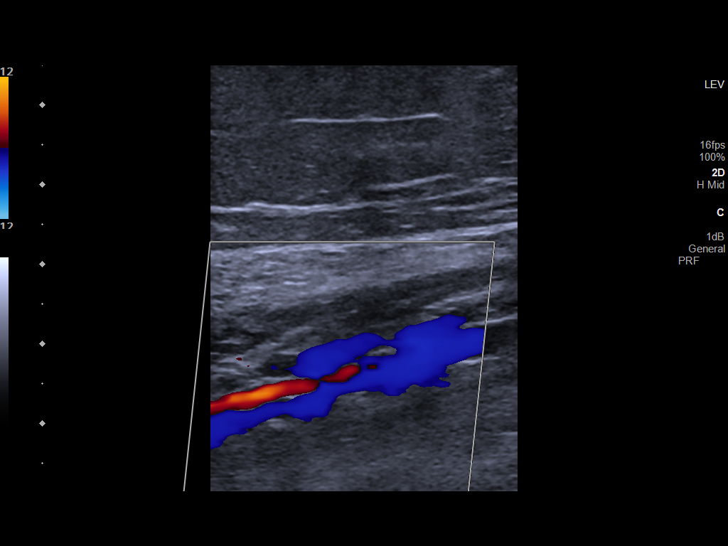
[im 29/40]
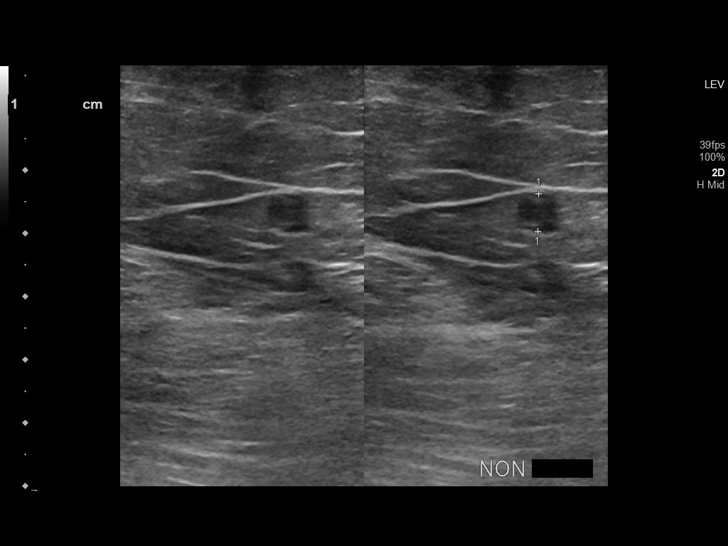
[im 33/40]
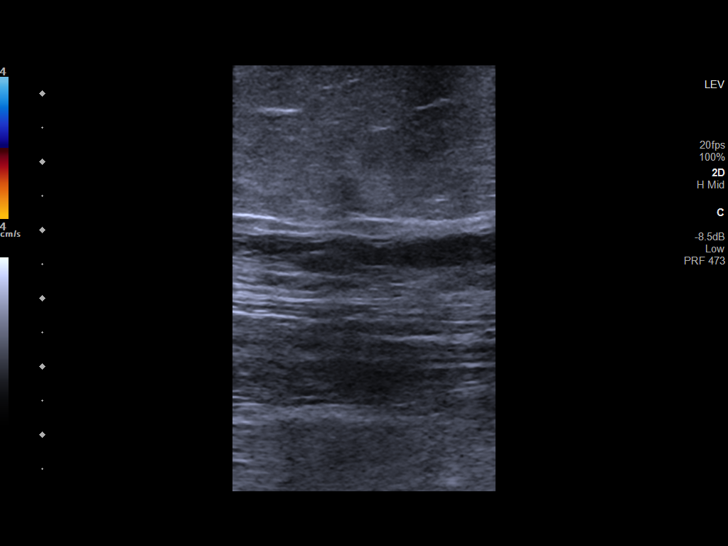
[im 36/40]
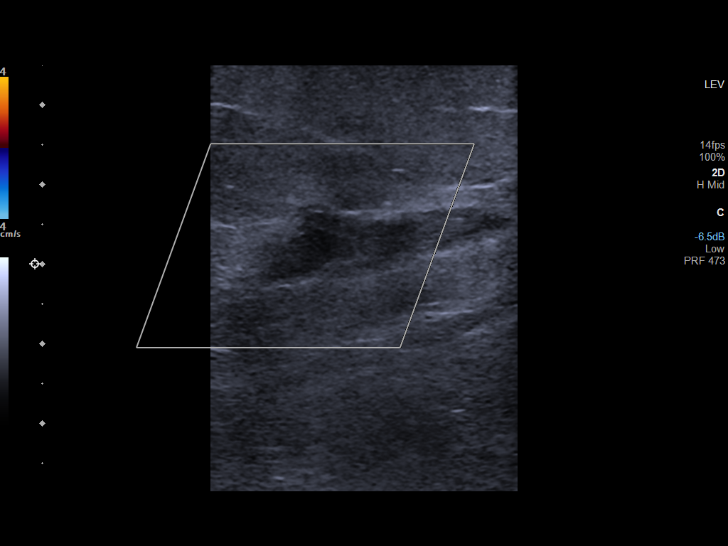
[im 40/40]
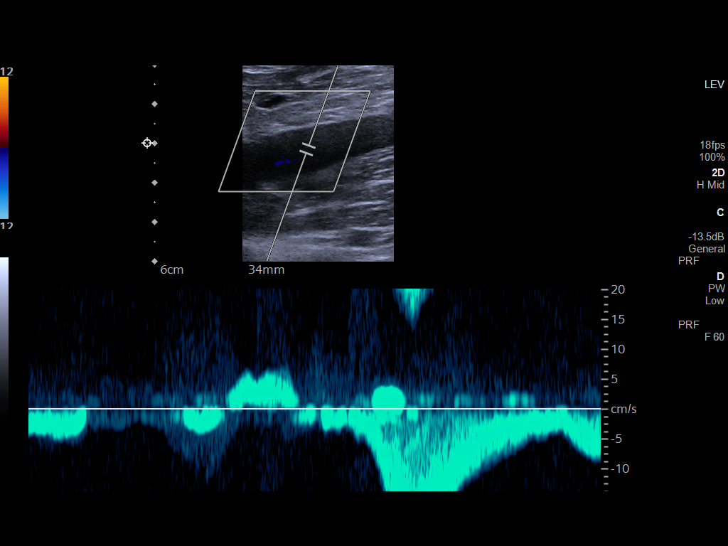

[13 of 24 positions shown; findings below may reference images not displayed]

FINDINGS: VENOUS

Normal compressibility of the common femoral, superficial femoral,
and popliteal veins, as well as the visualized calf veins.
Visualized portions of profunda femoral vein and great saphenous
vein unremarkable. No filling defects to suggest DVT on grayscale or
color Doppler imaging. Doppler waveforms show normal direction of
venous flow, normal respiratory phasicity and response to
augmentation.

There is hypoechoic noncompressible thrombus in the great saphenous
vein extending from the upper calf to the mid thigh.

Limited views of the contralateral common femoral vein are
unremarkable.

OTHER

None.

Limitations: none
IMPRESSION: 1. Negative for left lower extremity DVT.
2. Superficial thrombophlebitis involving the mid left great
saphenous vein.

## 2023-02-02 IMAGING — RF DG LUMBAR SPINE 2-3V
1 series · 2 of 2 positions shown · non-contrast
Comparison: MRI lumbar spine dated 05/16/2021

Fluoroscopy time: 2 minutes 39 seconds

187.29 mGy

CLINICAL DATA: Lumbar surgery

EXAM:
LUMBAR SPINE - 2-3 VIEW; DG C-ARM 1-60 MIN-NO REPORT

[Series 1: run · 2 of 2 slices shown]
[im 1/2]
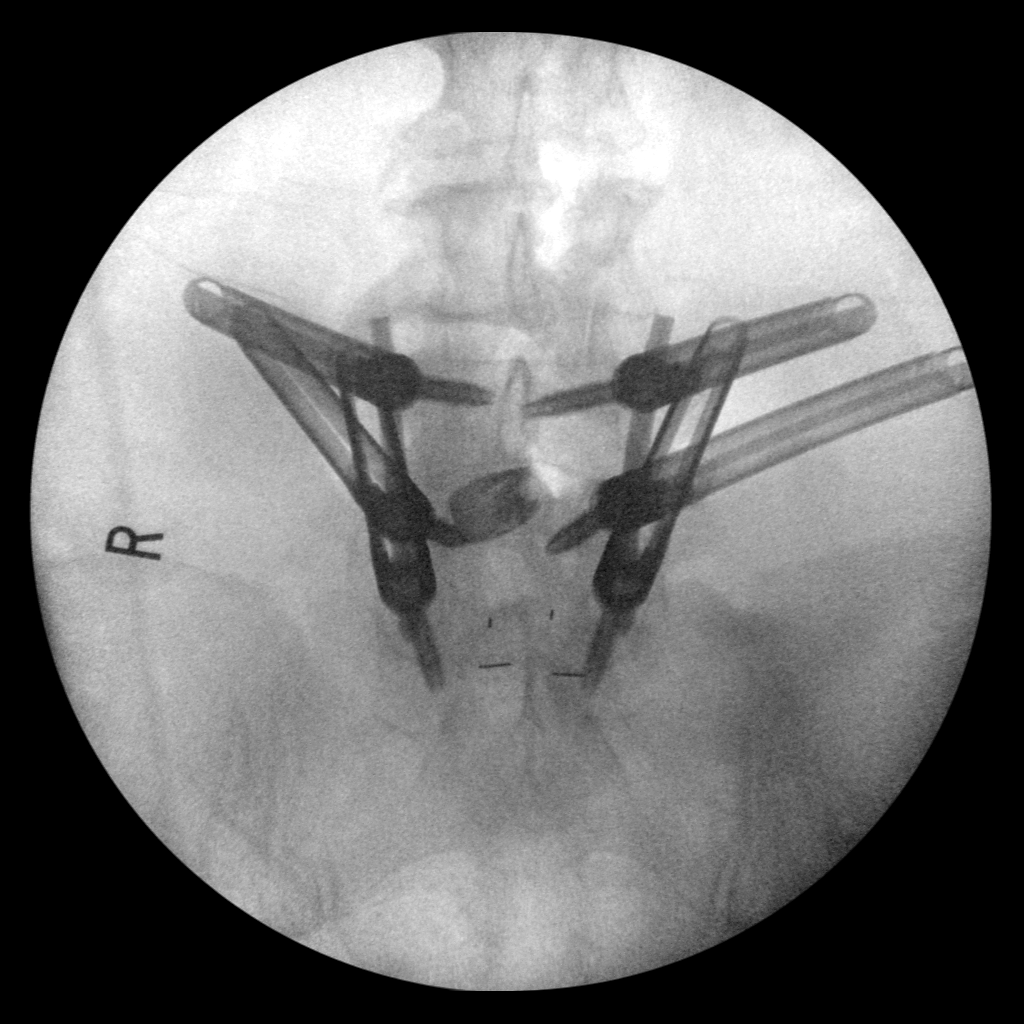
[im 2/2]
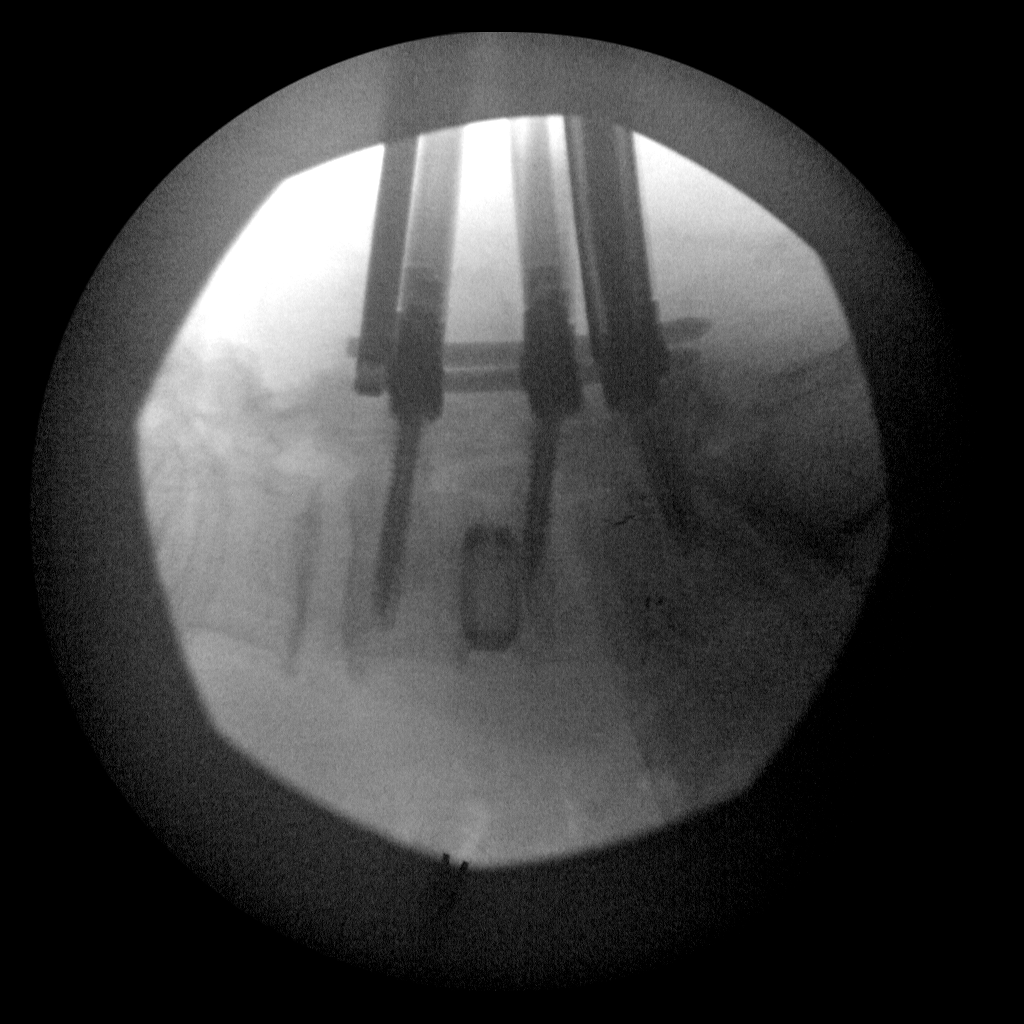

[2 of 2 positions shown; findings below may reference images not displayed]

FINDINGS: Intraoperative fluoroscopic frontal and lateral radiographs during
posterior surgical fusion of the mid/lower spine. Surgical levels
include L3-4, L4-5, and L5-S1.
IMPRESSION: Intraoperative fluoroscopic images during lumbar spine surgery, as
above.
